# Patient Record
Sex: Female | Born: 1995 | Race: Black or African American | Hispanic: No | Marital: Single | Smoking: Never smoker
Health system: Southern US, Community
[De-identification: ages and names within clinical notes are randomized; demographics above are authoritative.]

## PROBLEM LIST (undated history)

## (undated) DIAGNOSIS — Z789 Other specified health status: Secondary | ICD-10-CM

## (undated) HISTORY — PX: NO PAST SURGERIES: SHX2092

## (undated) HISTORY — DX: Other specified health status: Z78.9

---
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
## 2018-12-14 ENCOUNTER — Other Ambulatory Visit: Payer: Self-pay

## 2018-12-14 ENCOUNTER — Emergency Department
Admission: EM | Admit: 2018-12-14 | Discharge: 2018-12-14 | Disposition: A | Discharge status: Home / Self Care | Payer: Self-pay | Attending: Emergency Medicine | Admitting: Emergency Medicine

## 2018-12-14 ENCOUNTER — Encounter: Payer: Self-pay | Admitting: Emergency Medicine

## 2018-12-14 ENCOUNTER — Emergency Department: Payer: Self-pay

## 2018-12-14 DIAGNOSIS — M779 Enthesopathy, unspecified: Secondary | ICD-10-CM | POA: Insufficient documentation

## 2018-12-14 DIAGNOSIS — M778 Other enthesopathies, not elsewhere classified: Secondary | ICD-10-CM

## 2018-12-14 MED ORDER — MELOXICAM 15 MG PO TABS: 15.0000 mg | ORAL_TABLET | Freq: Every day | ORAL | 0 refills | Status: DC

## 2018-12-14 NOTE — ED Notes (Signed)
See triage note  States she developed a toe injury over 1 week ago  States she thinks she bent her left great toe  conts to have pain

## 2018-12-14 NOTE — ED Provider Notes (Signed)
[facility] Emergency Department Provider Note  ____________________________________________  Time seen: Approximately 6:23 PM  I have reviewed the triage vital signs and the nursing notes.   HISTORY  Chief Complaint Toe Pain    HPI Marie Faulkner is a 23 y.o. female who presents the emergency department complaining of intermittent pain to the dorsal aspect of the great digit left foot.  Patient reports that she had an injury to the toe several weeks or months ago.  Patient reports that she has had intermittent pain to the area that she describes as a burning sensation.  No other injury or complaint.  No medications for his complaint prior to arrival.    History reviewed. No pertinent past medical history.  There are no active problems to display for this patient.   History reviewed. No pertinent surgical history.  Prior to Admission medications   Medication Sig Start Date End Date Taking? Authorizing Provider  meloxicam (MOBIC) 15 MG tablet Take 1 tablet (15 mg total) by mouth daily. 12/14/18   , Delorise Royals, PA-C    Allergies Patient has no known allergies.  No family history on file.  Social History Social History   Tobacco Use  . Smoking status: Never Smoker  . Smokeless tobacco: Never Used  Substance Use Topics  . Alcohol use: No  . Drug use: Not on file     Review of Systems  Constitutional: No fever/chills Eyes: No visual changes.  Cardiovascular: no chest pain. Respiratory: no cough. No SOB. Gastrointestinal: No abdominal pain.  No nausea, no vomiting.  Musculoskeletal: Positive for pain along the dorsal aspect of the great toe left foot Skin: Negative for rash, abrasions, lacerations, ecchymosis. Neurological: Negative for headaches, focal weakness or numbness. 10-point ROS otherwise negative.  ____________________________________________   PHYSICAL EXAM:  VITAL SIGNS: ED Triage Vitals  Enc Vitals Group     BP  12/14/18 1652 126/75     Pulse Rate 12/14/18 1652 91     Resp 12/14/18 1652 18     Temp 12/14/18 1652 98.7 F (37.1 C)     Temp Source 12/14/18 1652 Oral     SpO2 12/14/18 1652 98 %     Weight 12/14/18 1654 168 lb (76.2 kg)     Height 12/14/18 1654 5\' 3"  (1.6 m)     Head Circumference --      Peak Flow --      Pain Score 12/14/18 1654 8     Pain Loc --      Pain Edu? --      Excl. in GC? --      Constitutional: Alert and oriented. Well appearing and in no acute distress. Eyes: Conjunctivae are normal. PERRL. EOMI. Head: Atraumatic. Neck: No stridor.    Cardiovascular: Normal rate, regular rhythm. Normal S1 and S2.  Good peripheral circulation. Respiratory: Normal respiratory effort without tachypnea or retractions. Lungs CTAB. Good air entry to the bases with no decreased or absent breath sounds. Musculoskeletal: Full range of motion to all extremities. No gross deformities appreciated.  Visualization of the left foot reveals no visible abnormality.  Full range of motion all toes.  Sensation intact all toes.  Capillary refill less than 2 seconds all digits.  Patient has mild pain with extension of the great toe but no palpable tenderness. Neurologic:  Normal speech and language. No gross focal neurologic deficits are appreciated.  Skin:  Skin is warm, dry and intact. No rash noted. Psychiatric: Mood and affect are normal.  Speech and behavior are normal. Patient exhibits appropriate insight and judgement.   ____________________________________________   LABS (all labs ordered are listed, but only abnormal results are displayed)  Labs Reviewed - No data to display ____________________________________________  EKG   ____________________________________________  RADIOLOGY I personally viewed and evaluated these images as part of my medical decision making, as well as reviewing the written report by the radiologist.  Dg Toe Great Left  Result Date: 12/14/2018 CLINICAL DATA:   Left great toe injury, pain EXAM: LEFT GREAT TOE COMPARISON:  None. FINDINGS: There is no evidence of fracture or dislocation. There is no evidence of arthropathy or other focal bone abnormality. Soft tissues are unremarkable. IMPRESSION: Negative. Electronically Signed   By: Charlett Nose M.D.   On: 12/14/2018 17:36    ____________________________________________    PROCEDURES  Procedure(s) performed:    Procedures    Medications - No data to display   ____________________________________________   INITIAL IMPRESSION / ASSESSMENT AND PLAN / ED COURSE  Pertinent labs & imaging results that were available during my care of the patient were reviewed by me and considered in my medical decision making (see chart for details).  Review of the [place] CSRS was performed in accordance of the [facility] prior to dispensing any controlled drugs.      Patient's diagnosis is consistent with tendinitis of the great toe.  Patient presents emergency department with intermittent pain to the great toe after an injury that occurred weeks to months ago.  Exam was reassuring.  X-ray reveals no acute osseous abnormality or signs of healing fracture.  Symptoms are most consistent with mild tendinitis..  Patiently placed on meloxicam.  Postop shoe given to the patient.  Follow-up with podiatry if symptoms do not improve.  Patient is given ED precautions to return to the ED for any worsening or new symptoms.     ____________________________________________  FINAL CLINICAL IMPRESSION(S) / ED DIAGNOSES  Final diagnoses:  Tendinitis of toe      NEW MEDICATIONS STARTED DURING THIS VISIT:  ED Discharge Orders         Ordered    meloxicam (MOBIC) 15 MG tablet  Daily     12/14/18 1848              This chart was dictated using voice recognition software/Dragon. Despite best efforts to proofread, errors can occur which can change the meaning. Any change was purely unintentional.    Racheal Patches, PA-C 12/14/18 1850    Phineas Semen, MD 12/14/18 Ernestina Columbia

## 2018-12-14 NOTE — ED Triage Notes (Signed)
PT reports injuring her left big toe over a week ago. Pt states the pain has been intermittent until today. Pt states the pain has been constant today.

## 2019-05-10 ENCOUNTER — Encounter: Payer: Self-pay | Admitting: Intensive Care

## 2019-05-10 ENCOUNTER — Emergency Department
Admission: EM | Admit: 2019-05-10 | Discharge: 2019-05-10 | Disposition: A | Discharge status: Home / Self Care | Payer: Self-pay | Attending: Emergency Medicine | Admitting: Emergency Medicine

## 2019-05-10 ENCOUNTER — Other Ambulatory Visit: Payer: Self-pay

## 2019-05-10 DIAGNOSIS — W25XXXA Contact with sharp glass, initial encounter: Secondary | ICD-10-CM | POA: Insufficient documentation

## 2019-05-10 DIAGNOSIS — S91332A Puncture wound without foreign body, left foot, initial encounter: Secondary | ICD-10-CM | POA: Insufficient documentation

## 2019-05-10 DIAGNOSIS — Y999 Unspecified external cause status: Secondary | ICD-10-CM | POA: Insufficient documentation

## 2019-05-10 DIAGNOSIS — Y9389 Activity, other specified: Secondary | ICD-10-CM | POA: Insufficient documentation

## 2019-05-10 DIAGNOSIS — Y929 Unspecified place or not applicable: Secondary | ICD-10-CM | POA: Insufficient documentation

## 2019-05-10 MED ORDER — SULFAMETHOXAZOLE-TRIMETHOPRIM 800-160 MG PO TABS: 1.0000 | ORAL_TABLET | Freq: Two times a day (BID) | ORAL | 0 refills | Status: DC

## 2019-05-10 MED ORDER — NAPROXEN 500 MG PO TABS: 500.0000 mg | ORAL_TABLET | Freq: Two times a day (BID) | ORAL | 0 refills | Status: DC

## 2019-05-10 NOTE — ED Triage Notes (Signed)
Pt states she is here due to stepping on glass yesterday. Injury to left foot.

## 2019-05-10 NOTE — ED Notes (Signed)
See triage note  Presents with pain to left foot   States she stepped on a piece of glass yesterday while cleaning a broken mirror  States she was able to remove some glass unsure if that is still some glass left

## 2019-05-10 NOTE — ED Provider Notes (Signed)
[facility] Emergency Department Provider Note   ____________________________________________   First MD Initiated Contact with Patient 05/10/19 1207     (approximate)  I have reviewed the triage vital signs and the nursing notes.   HISTORY  Chief Complaint Foot Injury    HPI Marie Faulkner is a [age] y.o. female patient presents with plantar puncture wound to the left foot.  Patient states she stepped on a piece of glass yesterday.  Patient states she removed glass intact but still has pain with ambulation and weightbearing.  Patient rates pain as a 7/10.  Patient described the pain is "achy".  No palliative measure for complaint.         History reviewed. No pertinent past medical history.  There are no active problems to display for this patient.   History reviewed. No pertinent surgical history.  Prior to Admission medications   Medication Sig Start Date End Date Taking? Authorizing Provider  naproxen (NAPROSYN) 500 MG tablet Take 1 tablet (500 mg total) by mouth 2 (two) times daily with a meal. 05/10/19   Joni ReiningSmith, Staphany Ditton K, PA-C  sulfamethoxazole-trimethoprim (BACTRIM DS) 800-160 MG tablet Take 1 tablet by mouth 2 (two) times daily. 05/10/19   Joni ReiningSmith, Qualyn Oyervides K, PA-C    Allergies Patient has no known allergies.  No family history on file.  Social History Social History   Tobacco Use  . Smoking status: Never Smoker  . Smokeless tobacco: Never Used  Substance Use Topics  . Alcohol use: No  . Drug use: Not on file    Review of Systems  Constitutional: No fever/chills Eyes: No visual changes. ENT: No sore throat. Cardiovascular: Denies chest pain. Respiratory: Denies shortness of breath. Gastrointestinal: No abdominal pain.  No nausea, no vomiting.  No diarrhea.  No constipation. Genitourinary: Negative for dysuria. Musculoskeletal: Negative for back pain. Skin: Negative for rash.  Puncture wound left foot Neurological: Negative for  headaches, focal weakness or numbness.   ____________________________________________   PHYSICAL EXAM:  VITAL SIGNS: ED Triage Vitals  Enc Vitals Group     BP 05/10/19 1206 105/70     Pulse Rate 05/10/19 1206 67     Resp 05/10/19 1206 16     Temp 05/10/19 1206 98.2 F (36.8 C)     Temp Source 05/10/19 1206 Oral     SpO2 05/10/19 1206 99 %     Weight 05/10/19 1207 160 lb (72.6 kg)     Height 05/10/19 1207 5\' 2"  (1.575 m)     Head Circumference --      Peak Flow --      Pain Score 05/10/19 1207 7     Pain Loc --      Pain Edu? --      Excl. in GC? --    Constitutional: Alert and oriented. Well appearing and in no acute distress. Cardiovascular: Normal rate, regular rhythm. Grossly normal heart sounds.  Good peripheral circulation. Respiratory: Normal respiratory effort.  No retractions. Lungs CTAB. Musculoskeletal: No lower extremity tenderness nor edema.  No joint effusions. Neurologic:  Normal speech and language. No gross focal neurologic deficits are appreciated. No gait instability. Skin: Puncture wound plantar aspect of left foot.  Moderate guarding palpation.  Mild erythema.  No evidence of retained foreign body. Psychiatric: Mood and affect are normal. Speech and behavior are normal.  ____________________________________________   LABS (all labs ordered are listed, but only abnormal results are displayed)  Labs Reviewed - No data to display ____________________________________________  EKG   ____________________________________________  RADIOLOGY  ED MD interpretation:    Official radiology report(s): No results found.  ____________________________________________   PROCEDURES  Procedure(s) performed (including Critical Care):  Procedures   ____________________________________________   INITIAL IMPRESSION / ASSESSMENT AND PLAN / ED COURSE  As part of my medical decision making, I reviewed the following data within the electronic medical  record:          Mckensie Chait was evaluated in Emergency Department on 05/10/2019 for the symptoms described in the history of present illness. She was evaluated in the context of the global COVID-19 pandemic, which necessitated consideration that the patient might be at risk for infection with the SARS-CoV-2 virus that causes COVID-19. Institutional protocols and algorithms that pertain to the evaluation of patients at risk for COVID-19 are in a state of rapid change based on information released by regulatory bodies including the CDC and federal and state organizations. These policies and algorithms were followed during the patient's care in the ED.   Patient presents with puncture wound to the left foot.  Incident occurred last night.  Patient did pain with weightbearing and ambulation.  Foot exam negative for retained foreign body.  Patient given discharge care instructions advised take medication as directed.  Patient advised [facility].  Patient given a work note for 2 days.      ____________________________________________   FINAL CLINICAL IMPRESSION(S) / ED DIAGNOSES  Final diagnoses:  Puncture wound of left foot, initial encounter     ED Discharge Orders         Ordered    naproxen (NAPROSYN) 500 MG tablet  2 times daily with meals     05/10/19 1232    sulfamethoxazole-trimethoprim (BACTRIM DS) 800-160 MG tablet  2 times daily     05/10/19 1232           Note:  This document was prepared using Dragon voice recognition software and may include unintentional dictation errors.    Sable Feil, PA-C 05/10/19 1237    Earleen Newport, MD 05/10/19 [phone]

## 2019-07-05 ENCOUNTER — Other Ambulatory Visit: Payer: Self-pay

## 2019-07-05 ENCOUNTER — Emergency Department (HOSPITAL_COMMUNITY)
Admission: EM | Admit: 2019-07-05 | Discharge: 2019-07-06 | Disposition: A | Discharge status: Home / Self Care | Payer: Self-pay | Attending: Emergency Medicine | Admitting: Emergency Medicine

## 2019-07-05 ENCOUNTER — Emergency Department (HOSPITAL_COMMUNITY): Payer: Self-pay

## 2019-07-05 DIAGNOSIS — Z791 Long term (current) use of non-steroidal anti-inflammatories (NSAID): Secondary | ICD-10-CM | POA: Insufficient documentation

## 2019-07-05 DIAGNOSIS — Z79899 Other long term (current) drug therapy: Secondary | ICD-10-CM | POA: Insufficient documentation

## 2019-07-05 DIAGNOSIS — M79641 Pain in right hand: Secondary | ICD-10-CM | POA: Insufficient documentation

## 2019-07-05 NOTE — ED Triage Notes (Signed)
Pt presents POV with Right hand pain, states she fell and her hand "slammed across the door."

## 2019-07-06 NOTE — ED Provider Notes (Signed)
[facility]ENC[facility] Provider Note   CSN: [id number] Arrival date & time: 07/05/19  2214     History   Chief Complaint Chief Complaint  Patient presents with  . Hand Pain    HPI Marie Faulkner is a 23 y.o. female.  Patient presents to the emergency department with a complaint of right hand pain. Symptoms began two hours ago when she smacked the back of her hand on a metal doorframe. Patient states pain is sharp and achy and constant. Endorses swelling and decreased ability to move her hand. Patient is concerned she broke her hand. No history of fractures.       HPI  No past medical history on file.  There are no active problems to display for this patient.   No past surgical history on file.   OB History   No obstetric history on file.      Home Medications    Prior to Admission medications   Medication Sig Start Date End Date Taking? Authorizing Provider  naproxen (NAPROSYN) 500 MG tablet Take 1 tablet (500 mg total) by mouth 2 (two) times daily with a meal. 05/10/19   Joni ReiningSmith, Ronald K, PA-C  sulfamethoxazole-trimethoprim (BACTRIM DS) 800-160 MG tablet Take 1 tablet by mouth 2 (two) times daily. 05/10/19   Joni ReiningSmith, Ronald K, PA-C    Family History No family history on file.  Social History Social History   Tobacco Use  . Smoking status: Never Smoker  . Smokeless tobacco: Never Used  Substance Use Topics  . Alcohol use: No  . Drug use: Not on file     Allergies   Patient has no known allergies.   Review of Systems Review of Systems  Constitutional: Negative for fever.  HENT: Negative for congestion.   Respiratory: Negative for shortness of breath.   Cardiovascular: Negative for chest pain.  Gastrointestinal: Negative for abdominal pain.  Genitourinary: Negative for dysuria.  Musculoskeletal: Positive for joint swelling. Negative for back pain.  Skin: Negative for rash.  Neurological: Negative for dizziness.      Physical Exam Updated Vital Signs BP 126/66 (BP Location: Left Arm)   Pulse 80   Temp 98.6 F (37 C) (Oral)   Resp 20   SpO2 98%   Physical Exam Physical Exam  Vitals signs and nursing note reviewed.  General: Patient is not in acute distress. Appearance: Normal appearance. He is not ill-appearing.  HENT:  Normocephalic and atraumatic.  Nose normal.  Eyes:  General: No scleral icterus.  Right eye: No discharge. Left eye: No discharge.  Conjunctiva/sclera: Conjunctivae normal.  Neck:  Normal range of motion. No neck tenderness. Cardiovascular:  Normal rate and regular rhythm.  Normal distal pulses  Pulmonary:  Effort: Pulmonary effort is normal. No respiratory distress.   Musculoskeletal:  ROM: Full active and passive ROM to the elbows, wrists with decreased range of motion of fingers secondary to pain.  Strength: no weakness noted  Sensation: Sensation intact symmetrically distal to location of pain.  Palpation: Tenderness to palpation over the right fifth metacarpal with point of maximal tenderness mid-metacarpal.  There is no snuffbox tenderness.  Skin:  Skin is warm and dry.  Capillary refill takes less than 2 seconds.  Neurological:  Patient is alert and oriented to person, place, and time. Mental status is at baseline.  Sensation noted in MSK exam. Psychiatric:  Mood normal. Behavior normal.  Nursing note and vitals reviewed.     ED Treatments / Results  Labs (  all labs ordered are listed, but only abnormal results are displayed) Labs Reviewed - No data to display  EKG    Radiology Dg Hand Complete Right  Result Date: 07/05/2019 CLINICAL DATA:  Right hand pain after fall EXAM: RIGHT HAND - COMPLETE 3+ VIEW COMPARISON:  None. FINDINGS: There is no evidence of fracture or dislocation. There is no evidence of arthropathy or other focal bone abnormality. Soft tissues are unremarkable. IMPRESSION: Negative. Electronically Signed   By: Donavan Foil M.D.    On: 07/05/2019 23:01    Procedures Procedures (including critical care time)  Medications Ordered in ED Medications - No data to display   Initial Impression / Assessment and Plan / ED Course  I have reviewed the triage vital signs and the nursing notes.  Pertinent labs & imaging results that were available during my care of the patient were reviewed by me and considered in my medical decision making (see chart for details).      patient is distally neurovascularly intact with stable vitals and clear mentation.   No fractures on x-ray of hand.  Patient is able to move fingers appropriately.  Patient educated on likely diagnosis of contusion discussed possibility of small, occult fracture which would need follow-up x-rays if symptoms persist.  Patient offered resources including orthopedic follow up, stretching exercises and NSAID use instructions. Patient is understanding of plan.    Final Clinical Impressions(s) / ED Diagnoses   Final diagnoses:  None    ED Discharge Orders    None        Tedd Sias, [place] 07/06/19 0101    Deno Etienne, DO 07/06/19 0330

## 2019-07-06 NOTE — Discharge Instructions (Addendum)
I did not see a primary care doctor in your chart so I included the information for the [facility].  Your examination today is most concerning for a hand contusion 1. Medications: alternate ibuprofen and tylenol for pain control, usual home medications 2. Treatment: rest, ice, elevate and drink plenty of fluids, gentle stretching 3. Follow Up: Please followup with orthopedics as directed or your PCP in 1 week if no improvement for discussion of your diagnoses and further evaluation after today's visit; if you do not have a primary care doctor use the resource guide provided to find one; Please return to the ER for worsening symptoms or other concerns.

## 2019-07-20 ENCOUNTER — Ambulatory Visit (LOCAL_COMMUNITY_HEALTH_CENTER): Payer: Self-pay | Admitting: Family Medicine

## 2019-07-20 ENCOUNTER — Other Ambulatory Visit: Payer: Self-pay

## 2019-07-20 ENCOUNTER — Encounter: Payer: Self-pay | Admitting: Family Medicine

## 2019-07-20 VITALS — BP 103/57 | Ht 62.5 in | Wt 175.6 lb

## 2019-07-20 DIAGNOSIS — Z3009 Encounter for other general counseling and advice on contraception: Secondary | ICD-10-CM

## 2019-07-20 MED ORDER — MULTIVITAMINS PO CAPS: 1.0000 | ORAL_CAPSULE | Freq: Every day | ORAL | 0 refills | Status: DC

## 2019-07-20 NOTE — Progress Notes (Signed)
   IUD Removal  Patient identified, informed consent performed, consent signed.  Patient was in the dorsal lithotomy position, normal external genitalia was noted.  A speculum was placed in the patient's vagina, normal discharge was noted, no lesions. The cervix was visualized, no lesions, no abnormal discharge.  The strings of the IUD were grasped and pulled using ring forceps. The IUD was removed in its entirety.  Patient tolerated the procedure well.    Patient will use abstanance for contraception. Routine preventative health maintenance measures emphasized. Co. To notify clinic if she wanted another method.  Offered condoms

## 2019-07-20 NOTE — Progress Notes (Signed)
Pt desires to have Mirena IUD removed, has had it for 5 years.  Last sex was 2 days ago, without a condom. Abstinence consent signed. MVI given.

## 2020-05-31 ENCOUNTER — Inpatient Hospital Stay (HOSPITAL_COMMUNITY)
Admission: AD | Admit: 2020-05-31 | Discharge: 2020-05-31 | Disposition: A | Discharge status: Home / Self Care | Payer: Medicaid Other | Attending: Obstetrics & Gynecology | Admitting: Obstetrics & Gynecology

## 2020-05-31 ENCOUNTER — Encounter (HOSPITAL_COMMUNITY): Payer: Self-pay | Admitting: Obstetrics & Gynecology

## 2020-05-31 ENCOUNTER — Inpatient Hospital Stay (HOSPITAL_COMMUNITY): Payer: Medicaid Other

## 2020-05-31 ENCOUNTER — Other Ambulatory Visit: Payer: Self-pay

## 2020-05-31 DIAGNOSIS — O99321 Drug use complicating pregnancy, first trimester: Secondary | ICD-10-CM | POA: Diagnosis not present

## 2020-05-31 DIAGNOSIS — F1290 Cannabis use, unspecified, uncomplicated: Secondary | ICD-10-CM | POA: Diagnosis not present

## 2020-05-31 DIAGNOSIS — O219 Vomiting of pregnancy, unspecified: Secondary | ICD-10-CM

## 2020-05-31 DIAGNOSIS — Z791 Long term (current) use of non-steroidal anti-inflammatories (NSAID): Secondary | ICD-10-CM | POA: Diagnosis not present

## 2020-05-31 DIAGNOSIS — Z3490 Encounter for supervision of normal pregnancy, unspecified, unspecified trimester: Secondary | ICD-10-CM

## 2020-05-31 DIAGNOSIS — O208 Other hemorrhage in early pregnancy: Secondary | ICD-10-CM

## 2020-05-31 DIAGNOSIS — Z3A09 9 weeks gestation of pregnancy: Secondary | ICD-10-CM | POA: Insufficient documentation

## 2020-05-31 DIAGNOSIS — O418X10 Other specified disorders of amniotic fluid and membranes, first trimester, not applicable or unspecified: Secondary | ICD-10-CM

## 2020-05-31 DIAGNOSIS — Z3A01 Less than 8 weeks gestation of pregnancy: Secondary | ICD-10-CM

## 2020-05-31 DIAGNOSIS — R8271 Bacteriuria: Secondary | ICD-10-CM

## 2020-05-31 HISTORY — DX: Other specified health status: Z78.9

## 2020-05-31 LAB — URINALYSIS, ROUTINE W REFLEX MICROSCOPIC
Bilirubin Urine: NEGATIVE
Glucose, UA: NEGATIVE mg/dL
Hgb urine dipstick: NEGATIVE
Ketones, ur: NEGATIVE mg/dL
Nitrite: NEGATIVE
Protein, ur: NEGATIVE mg/dL
Specific Gravity, Urine: 1.005 (ref 1.005–1.030)
pH: 8 (ref 5.0–8.0)

## 2020-05-31 LAB — POCT PREGNANCY, URINE: Preg Test, Ur: POSITIVE — AB

## 2020-05-31 LAB — ABO/RH: ABO/RH(D): O POS

## 2020-05-31 MED ORDER — PROMETHAZINE HCL 25 MG PO TABS
12.5000 mg | ORAL_TABLET | Freq: Once | ORAL | Status: AC
  Administered 2020-05-31: 12.5 mg via ORAL
  Filled 2020-05-31: qty 1

## 2020-05-31 MED ORDER — PROMETHAZINE HCL 12.5 MG RE SUPP: 12.5000 mg | Freq: Four times a day (QID) | RECTAL | 1 refills | Status: DC | PRN

## 2020-05-31 MED ORDER — PROMETHAZINE HCL 12.5 MG PO TABS: 12.5000 mg | ORAL_TABLET | Freq: Four times a day (QID) | ORAL | 2 refills | Status: DC | PRN

## 2020-05-31 MED ORDER — FAMOTIDINE 20 MG PO TABS: 20.0000 mg | ORAL_TABLET | Freq: Every day | ORAL | 1 refills | Status: DC

## 2020-05-31 MED ORDER — FAMOTIDINE 20 MG PO TABS
20.0000 mg | ORAL_TABLET | Freq: Once | ORAL | Status: AC
  Administered 2020-05-31: 20 mg via ORAL
  Filled 2020-05-31: qty 1

## 2020-05-31 NOTE — Discharge Instructions (Signed)
Safe Medications in Pregnancy    Acne: Benzoyl Peroxide Salicylic Acid  Backache/Headache: Tylenol: 2 regular strength every 4 hours OR              2 Extra strength every 6 hours  Colds/Coughs/Allergies: Benadryl (alcohol free) 25 mg every 6 hours as needed Breath right strips Claritin Cepacol throat lozenges Chloraseptic throat spray Cold-Eeze- up to three times per day Cough drops, alcohol free Flonase (by prescription only) Guaifenesin Mucinex Robitussin DM (plain only, alcohol free) Saline nasal spray/drops Sudafed (pseudoephedrine) & Actifed ** use only after [redacted] weeks gestation and if you do not have high blood pressure Tylenol Vicks Vaporub Zinc lozenges Zyrtec   Constipation: Colace Ducolax suppositories Fleet enema Glycerin suppositories Metamucil Milk of magnesia Miralax Senokot Smooth move tea  Diarrhea: Kaopectate Imodium A-D  *NO pepto Bismol  Hemorrhoids: Anusol Anusol HC Preparation H Tucks  Indigestion: Tums Maalox Mylanta Zantac  Pepcid  Insomnia: Benadryl (alcohol free) 25mg  every 6 hours as needed Tylenol PM Unisom, no Gelcaps  Leg Cramps: Tums MagGel  Nausea/Vomiting:  Bonine Dramamine Emetrol Ginger extract Sea bands Meclizine  Nausea medication to take during pregnancy:  Unisom (doxylamine succinate 25 mg tablets) Take one tablet daily at bedtime. If symptoms are not adequately controlled, the dose can be increased to a maximum recommended dose of two tablets daily (1/2 tablet in the morning, 1/2 tablet mid-afternoon and one at bedtime). Vitamin B6 100mg  tablets. Take one tablet twice a day (up to 200 mg per day).  Skin Rashes: Aveeno products Benadryl cream or 25mg  every 6 hours as needed Calamine Lotion 1% cortisone cream  Yeast infection: Gyne-lotrimin 7 Monistat 7   **If taking multiple medications, please check labels to avoid duplicating the same active ingredients **take  medication as directed on the label ** Do not exceed 4000 mg of tylenol in 24 hours **Do not take medications that contain aspirin or ibuprofen          Prenatal Care Providers           [facility] @ [facility] - accepts patients without insurance  Phone: [phone]  Center for @ [facility]   Phone: [phone]  [facility] @[place]       Phone: [phone]            [facility] @ [place]     Phone: [phone]          Center for [id number] @ [facility]   Phone: [phone]  [facility] @ [facility] - accepts patients without insurance  Phone: [phone]  [facility] for [facility] @ [facility] Phone: [phone]     [facility] Department - accepts patients without insurance Phone: [phone]  [facility]  Phone: [phone]  [facility] Phone: [phone]  [facility] Phone: [phone]  [facility] Phone: [phone]  [facility] Phone: [phone]  [facility]  Phone: [phone]         First Trimester of Pregnancy The first trimester of pregnancy is from week 1 until the end of week 13 (months 1 through 3). A week after a sperm fertilizes an egg, the egg will implant on the wall of the uterus. This embryo will begin to develop into a baby. Genes from you and your partner will form the baby. The female genes will determine whether the baby will be a boy or a girl. At 6-8  weeks, the eyes and face will be formed, and the heartbeat can be seen on ultrasound. At the end of 12 weeks, all the baby's organs will be formed. Now that you are pregnant, you will want to do everything you can to have a healthy baby. Two of the most important things are to get good prenatal care and to follow your health care provider's instructions. Prenatal care is all the  medical care you receive before the baby's birth. This care will help prevent, find, and treat any problems during the pregnancy and childbirth. Body changes during your first trimester Your body goes through many changes during pregnancy. The changes vary from woman to woman.  You may gain or lose a couple of pounds at first.  You may feel sick to your stomach (nauseous) and you may throw up (vomit). If the vomiting is uncontrollable, call your health care provider.  You may tire easily.  You may develop headaches that can be relieved by medicines. All medicines should be approved by your health care provider.  You may urinate more often. Painful urination may mean you have a bladder infection.  You may develop heartburn as a result of your pregnancy.  You may develop constipation because certain hormones are causing the muscles that push stool through your intestines to slow down.  You may develop hemorrhoids or swollen veins (varicose veins).  Your breasts may begin to grow larger and become tender. Your nipples may stick out more, and the tissue that surrounds them (areola) may become darker.  Your gums may bleed and may be sensitive to brushing and flossing.  Dark spots or blotches (chloasma, mask of pregnancy) may develop on your face. This will likely fade after the baby is born.  Your menstrual periods will stop.  You may have a loss of appetite.  You may develop cravings for certain kinds of food.  You may have changes in your emotions from day to day, such as being excited to be pregnant or being concerned that something may go wrong with the pregnancy and baby.  You may have more vivid and strange dreams.  You may have changes in your hair. These can include thickening of your hair, rapid growth, and changes in texture. Some women also have hair loss during or after pregnancy, or hair that feels dry or thin. Your hair will most likely return to normal after your baby is  born. What to expect at prenatal visits During a routine prenatal visit:  You will be weighed to make sure you and the baby are growing normally.  Your blood pressure will be taken.  Your abdomen will be measured to track your baby's growth.  The fetal heartbeat will be listened to between weeks 10 and 14 of your pregnancy.  Test results from any previous visits will be discussed. Your health care provider may ask you:  How you are feeling.  If you are feeling the baby move.  If you have had any abnormal symptoms, such as leaking fluid, bleeding, severe headaches, or abdominal cramping.  If you are using any tobacco products, including cigarettes, chewing tobacco, and electronic cigarettes.  If you have any questions. Other tests that may be performed during your first trimester include:  Blood tests to find your blood type and to check for the presence of any previous infections. The tests will also be used to check for low iron levels (anemia) and protein on red blood cells (Rh antibodies). Depending on your risk  factors, or if you previously had diabetes during pregnancy, you may have tests to check for high blood sugar that affects pregnant women (gestational diabetes).  Urine tests to check for infections, diabetes, or protein in the urine.  An ultrasound to confirm the proper growth and development of the baby.  Fetal screens for spinal cord problems (spina bifida) and Down syndrome.  HIV (human immunodeficiency virus) testing. Routine prenatal testing includes screening for HIV, unless you choose not to have this test.  You may need other tests to make sure you and the baby are doing well. Follow these instructions at home: Medicines  Follow your health care provider's instructions regarding medicine use. Specific medicines may be either safe or unsafe to take during pregnancy.  Take a prenatal vitamin that contains at least 600 micrograms (mcg) of folic acid.  If  you develop constipation, try taking a stool softener if your health care provider approves. Eating and drinking   Eat a balanced diet that includes fresh fruits and vegetables, whole grains, good sources of protein such as meat, eggs, or tofu, and low-fat dairy. Your health care provider will help you determine the amount of weight gain that is right for you.  Avoid raw meat and uncooked cheese. These carry germs that can cause birth defects in the baby.  Eating four or five small meals rather than three large meals a day may help relieve nausea and vomiting. If you start to feel nauseous, eating a few soda crackers can be helpful. Drinking liquids between meals, instead of during meals, also seems to help ease nausea and vomiting.  Limit foods that are high in fat and processed sugars, such as fried and sweet foods.  To prevent constipation: ? Eat foods that are high in fiber, such as fresh fruits and vegetables, whole grains, and beans. ? Drink enough fluid to keep your urine clear or pale yellow. Activity  Exercise only as directed by your health care provider. Most women can continue their usual exercise routine during pregnancy. Try to exercise for 30 minutes at least 5 days a week. Exercising will help you: ? Control your weight. ? Stay in shape. ? Be prepared for labor and delivery.  Experiencing pain or cramping in the lower abdomen or lower back is a good sign that you should stop exercising. Check with your health care provider before continuing with normal exercises.  Try to avoid standing for long periods of time. Move your legs often if you must stand in one place for a long time.  Avoid heavy lifting.  Wear low-heeled shoes and practice good posture.  You may continue to have sex unless your health care provider tells you not to. Relieving pain and discomfort  Wear a good support bra to relieve breast tenderness.  Take warm sitz baths to soothe any pain or discomfort  caused by hemorrhoids. Use hemorrhoid cream if your health care provider approves.  Rest with your legs elevated if you have leg cramps or low back pain.  If you develop varicose veins in your legs, wear support hose. Elevate your feet for 15 minutes, 3-4 times a day. Limit salt in your diet. Prenatal care  Schedule your prenatal visits by the twelfth week of pregnancy. They are usually scheduled monthly at first, then more often in the last 2 months before delivery.  Write down your questions. Take them to your prenatal visits.  Keep all your prenatal visits as told by your health care provider. This is  important. Safety  Wear your seat belt at all times when driving.  Make a list of emergency phone numbers, including numbers for family, friends, the hospital, and police and fire departments. General instructions  Ask your health care provider for a referral to a local prenatal education class. Begin classes no later than the beginning of month 6 of your pregnancy.  Ask for help if you have counseling or nutritional needs during pregnancy. Your health care provider can offer advice or refer you to specialists for help with various needs.  Do not use hot tubs, steam rooms, or saunas.  Do not douche or use tampons or scented sanitary pads.  Do not cross your legs for long periods of time.  Avoid cat litter boxes and soil used by cats. These carry germs that can cause birth defects in the baby and possibly loss of the fetus by miscarriage or stillbirth.  Avoid all smoking, herbs, alcohol, and medicines not prescribed by your health care provider. Chemicals in these products affect the formation and growth of the baby.  Do not use any products that contain nicotine or tobacco, such as cigarettes and e-cigarettes. If you need help quitting, ask your health care provider. You may receive counseling support and other resources to help you quit.  Schedule a dentist appointment. At home,  brush your teeth with a soft toothbrush and be gentle when you floss. Contact a health care provider if:  You have dizziness.  You have mild pelvic cramps, pelvic pressure, or nagging pain in the abdominal area.  You have persistent nausea, vomiting, or diarrhea.  You have a bad smelling vaginal discharge.  You have pain when you urinate.  You notice increased swelling in your face, hands, legs, or ankles.  You are exposed to fifth disease or chickenpox.  You are exposed to [place] measles (rubella) and have never had it. Get help right away if:  You have a fever.  You are leaking fluid from your vagina.  You have spotting or bleeding from your vagina.  You have severe abdominal cramping or pain.  You have rapid weight gain or loss.  You vomit blood or material that looks like coffee grounds.  You develop a severe headache.  You have shortness of breath.  You have any kind of trauma, such as from a fall or a car accident. Summary  The first trimester of pregnancy is from week 1 until the end of week 13 (months 1 through 3).  Your body goes through many changes during pregnancy. The changes vary from woman to woman.  You will have routine prenatal visits. During those visits, your health care provider will examine you, discuss any test results you may have, and talk with you about how you are feeling. This information is not intended to replace advice given to you by your health care provider. Make sure you discuss any questions you have with your health care provider. Document Revised: 09/18/2017 Document Reviewed: 09/17/2016 Elsevier Patient Education  2020 Elsevier Inc.        Morning Sickness  Morning sickness is when a woman feels nauseous during pregnancy. This nauseous feeling may or may not come with vomiting. It often occurs in the morning, but it can be a problem at any time of day. Morning sickness is most common during the first trimester. In some  cases, it may continue throughout pregnancy. Although morning sickness is unpleasant, it is usually harmless unless the woman develops severe and continual vomiting (hyperemesis  gravidarum), a condition that requires more intense treatment. What are the causes? The exact cause of this condition is not known, but it seems to be related to normal hormonal changes that occur in pregnancy. What increases the risk? You are more likely to develop this condition if:  You experienced nausea or vomiting before your pregnancy.  You had morning sickness during a previous pregnancy.  You are pregnant with more than one baby, such as twins. What are the signs or symptoms? Symptoms of this condition include:  Nausea.  Vomiting. How is this diagnosed? This condition is usually diagnosed based on your signs and symptoms. How is this treated? In many cases, treatment is not needed for this condition. Making some changes to what you eat may help to control symptoms. Your health care provider may also prescribe or recommend:  Vitamin B6 supplements.  Anti-nausea medicines.  Ginger. Follow these instructions at home: Medicines  Take over-the-counter and prescription medicines only as told by your health care provider. Do not use any prescription, over-the-counter, or herbal medicines for morning sickness without first talking with your health care provider.  Taking multivitamins before getting pregnant can prevent or decrease the severity of morning sickness in most women. Eating and drinking  Eat a piece of dry toast or crackers before getting out of bed in the morning.  Eat 5 or 6 small meals a day.  Eat dry and bland foods, such as rice or a baked potato. Foods that are high in carbohydrates are often helpful.  Avoid greasy, fatty, and spicy foods.  Have someone cook for you if the smell of any food causes nausea and vomiting.  If you feel nauseous after taking prenatal vitamins, take the  vitamins at night or with a snack.  Snack on protein foods between meals if you are hungry. Nuts, yogurt, and cheese are good options.  Drink fluids throughout the day.  Try ginger ale made with real ginger, ginger tea made from fresh grated ginger, or ginger candies. General instructions  Do not use any products that contain nicotine or tobacco, such as cigarettes and e-cigarettes. If you need help quitting, ask your health care provider.  Get an air purifier to keep the air in your house free of odors.  Get plenty of fresh air.  Try to avoid odors that trigger your nausea.  Consider trying these methods to help relieve symptoms: ? Wearing an acupressure wristband. These wristbands are often worn for seasickness. ? Acupuncture. Contact a health care provider if:  Your home remedies are not working and you need medicine.  You feel dizzy or light-headed.  You are losing weight. Get help right away if:  You have persistent and uncontrolled nausea and vomiting.  You faint.  You have severe pain in your abdomen. Summary  Morning sickness is when a woman feels nauseous during pregnancy. This nauseous feeling may or may not come with vomiting.  Morning sickness is most common during the first trimester.  It often occurs in the morning, but it can be a problem at any time of day.  In many cases, treatment is not needed for this condition. Making some changes to what you eat may help to control symptoms. This information is not intended to replace advice given to you by your health care provider. Make sure you discuss any questions you have with your health care provider. Document Revised: 09/18/2017 Document Reviewed: 11/08/2016 Elsevier Patient Education  2020 [employer].  Hyperemesis Gravidarum Hyperemesis gravidarum is a severe form of nausea and vomiting that happens during pregnancy. Hyperemesis is worse than morning sickness. It may cause you to have  nausea or vomiting all day for many days. It may keep you from eating and drinking enough food and liquids, which can lead to dehydration, malnutrition, and weight loss. Hyperemesis usually occurs during the first half (the first 20 weeks) of pregnancy. It often goes away once a woman is in her second half of pregnancy. However, sometimes hyperemesis continues through an entire pregnancy. What are the causes? The cause of this condition is not known. It may be related to changes in chemicals (hormones) in the body during pregnancy, such as the high level of pregnancy hormone (human chorionic gonadotropin) or the increase in the female sex hormone (estrogen). What are the signs or symptoms? Symptoms of this condition include:  Nausea that does not go away.  Vomiting that does not allow you to keep any food down.  Weight loss.  Body fluid loss (dehydration).  Having no desire to eat, or not liking food that you have previously enjoyed. How is this diagnosed? This condition may be diagnosed based on:  A physical exam.  Your medical history.  Your symptoms.  Blood tests.  Urine tests. How is this treated? This condition is managed by controlling symptoms. This may include:  Following an eating plan. This can help lessen nausea and vomiting.  Taking prescription medicines. An eating plan and medicines are often used together to help control symptoms. If medicines do not help relieve nausea and vomiting, you may need to receive fluids through an IV at the hospital. Follow these instructions at home: Eating and drinking   Avoid the following: ? Drinking fluids with meals. Try not to drink anything during the 30 minutes before and after your meals. ? Drinking more than 1 cup of fluid at a time. ? Eating foods that trigger your symptoms. These may include spicy foods, coffee, high-fat foods, very sweet foods, and acidic foods. ? Skipping meals. Nausea can be more intense on an empty  stomach. If you cannot tolerate food, do not force it. Try sucking on ice chips or other frozen items and make up for missed calories later. ? Lying down within 2 hours after eating. ? Being exposed to environmental triggers. These may include food smells, smoky rooms, closed spaces, rooms with strong smells, warm or humid places, overly loud and noisy rooms, and rooms with motion or flickering lights. Try eating meals in a well-ventilated area that is free of strong smells. ? Quick and sudden changes in your movement. ? Taking iron pills and multivitamins that contain iron. If you take prescription iron pills, do not stop taking them unless your health care provider approves. ? Preparing food. The smell of food can spoil your appetite or trigger nausea.  To help relieve your symptoms: ? Listen to your body. Everyone is different and has different preferences. Find what works best for you. ? Eat and drink slowly. ? Eat 5-6 small meals daily instead of 3 large meals. Eating small meals and snacks can help you avoid an empty stomach. ? In the morning, before getting out of bed, eat a couple of crackers to avoid moving around on an empty stomach. ? Try eating starchy foods as these are usually tolerated well. Examples include cereal, toast, bread, potatoes, pasta, rice, and pretzels. ? Include at least 1 serving of protein with your meals and snacks. Protein options  include lean meats, poultry, seafood, beans, nuts, nut butters, eggs, cheese, and yogurt. ? Try eating a protein-rich snack before bed. Examples of a protein-rick snack include cheese and crackers or a peanut butter sandwich made with 1 slice of whole-wheat bread and 1 tsp (5 g) of peanut butter. ? Eat or suck on things that have ginger in them. It may help relieve nausea. Add  tsp ground ginger to hot tea or choose ginger tea. ? Try drinking 100% fruit juice or an electrolyte drink. An electrolyte drink contains sodium, potassium, and  chloride. ? Drink fluids that are cold, clear, and carbonated or sour. Examples include lemonade, ginger ale, lemon-lime soda, ice water, and sparkling water. ? Brush your teeth or use a mouth rinse after meals. ? Talk with your health care provider about starting a supplement of vitamin B6. General instructions  Take over-the-counter and prescription medicines only as told by your health care provider.  Follow instructions from your health care provider about eating or drinking restrictions.  Continue to take your prenatal vitamins as told by your health care provider. If you are having trouble taking your prenatal vitamins, talk with your health care provider about different options.  Keep all follow-up and pre-birth (prenatal) visits as told by your health care provider. This is important. Contact a health care provider if:  You have pain in your abdomen.  You have a severe headache.  You have vision problems.  You are losing weight.  You feel weak or dizzy. Get help right away if:  You cannot drink fluids without vomiting.  You vomit blood.  You have constant nausea and vomiting.  You are very weak.  You faint.  You have a fever and your symptoms suddenly get worse. Summary  Hyperemesis gravidarum is a severe form of nausea and vomiting that happens during pregnancy.  Making some changes to your eating habits may help relieve nausea and vomiting.  This condition may be managed with medicine.  If medicines do not help relieve nausea and vomiting, you may need to receive fluids through an IV at the hospital. This information is not intended to replace advice given to you by your health care provider. Make sure you discuss any questions you have with your health care provider. Document Revised: 10/26/2017 Document Reviewed: 06/04/2016 Elsevier Patient Education  2020 Elsevier Inc.        Cannabinoid Hyperemesis Syndrome Cannabinoid hyperemesis syndrome (CHS)  is a condition that causes repeated nausea, vomiting, and abdominal pain after long-term (chronic) use of marijuana (cannabis). People with CHS typically use marijuana 3-5 times a day for many years before they have symptoms, although it is possible to develop CHS with as little as 1 use per day. Symptoms of CHS may be mild at first but can get worse and more frequent. In some cases, CHS may cause vomiting many times a day, which can lead to weight loss and dehydration. CHS may go away and come back many times (recur). People may not have symptoms or may otherwise be healthy in between [facility] attacks. What are the causes? The exact cause of this condition is not known. Long-term use of marijuana may over-stimulate certain proteins in the brain that react with chemicals in marijuana (cannabinoid receptors). This over-stimulation may cause CHS. What are the signs or symptoms? Symptoms of this condition are often mild during the first few attacks, but they can get worse over time. Symptoms may include:  Frequent nausea, especially early in the morning.  Vomiting.  Abdominal pain. Taking several hot showers throughout the day can also be a sign of this condition. People with CHS may do this because it relieves symptoms. How is this diagnosed? This condition may be diagnosed based on:  Your symptoms and medical history, including any drug use.  A physical exam. You may have tests done to rule out other problems. These tests may include:  Blood tests.  Urine tests.  Imaging tests, such as an X-ray or CT scan. How is this treated? Treatment for this condition involves stopping marijuana use. Your health care provider may recommend:  A drug rehabilitation program, if you have trouble stopping marijuana use.  Medicines for nausea.  Hot showers to help relieve symptoms. Certain creams that contain a substance called capsaicin may improve symptoms when applied to the abdomen. Ask your health care  provider before starting any medicines or other treatments. Severe nausea and vomiting may require you to stay at the hospital. You may need IV fluids to prevent or treat dehydration. You may also need certain medicines that must be given at the hospital. Follow these instructions at home: During an attack   Stay in bed and rest in a dark, quiet room.  Take anti-nausea medicine as told by your health care provider.  Try taking hot showers to relieve your symptoms. After an attack  Drink small amounts of clear fluids slowly. Gradually add more.  Once you are able to eat without vomiting, eat soft foods in small amounts every 3-4 hours. General instructions   Do not use any products that contain marijuana.If you need help quitting, ask your health care provider for resources and treatment options.  Drink enough fluid to keep your urine pale yellow. Avoid drinking fluids that have a lot of sugar or caffeine, such as coffee and soda.  Take and apply over-the-counter and prescription medicines only as told by your health care provider. Ask your health care provider before starting any new medicines or treatments.  Keep all follow-up visits as told by your health care provider. This is important. Contact a health care provider if:  Your symptoms get worse.  You cannot drink fluids without vomiting.  You have pain and trouble swallowing after an attack. Get help right away if:  You cannot stop vomiting.  You have blood in your vomit or your vomit looks like coffee grounds.  You have severe abdominal pain.  You have stools that are bloody or black, or stools that look like tar.  You have symptoms of dehydration, such as: ? Sunken eyes. ? Inability to make tears. ? Cracked lips. ? Dry mouth. ? Decreased urine production. ? Weakness. ? Sleepiness. ? Fainting. Summary  Cannabinoid hyperemesis syndrome (CHS) is a condition that causes repeated nausea, vomiting, and abdominal  pain after long-term use of marijuana.  People with CHS typically use marijuana 3-5 times a day for many years before they have symptoms, although it is possible to develop CHS with as little as 1 use per day.  Treatment for this condition involves stopping marijuana use. Hot showers and capsaicin creams may also help relieve symptoms. Ask your health care provider before starting any medicines or other treatments.  Your health care provider may prescribe medicines to help with nausea.  Get help right away if you have signs of dehydration, such as dry mouth, decreased urine production, or weakness. This information is not intended to replace advice given to you by your health care provider. Make sure you discuss  any questions you have with your health care provider. Document Revised: 02/12/2018 Document Reviewed: 01/14/2017 Elsevier Patient Education  2020 [employer].        Marijuana Use During Pregnancy and Breastfeeding  Marijuana is the dried leaves, flowers, and stems of the Cannabis sativa or Cannabis indica plant. The plant's active ingredients (cannabinoids), including a chemical called THC, change the chemistry of the brain. Marijuana smoke also has many of the same chemicals as cigarette smoke that cause breathing problems. Marijuana gets into your blood through your lungs when you smoke it and through your digestive system when you swallow it. Using marijuana in any form may be harmful for you and your baby when you are trying to become pregnant and during pregnancy. This includes marijuana that is prescribed to you by a health care provider (medical marijuana). Once marijuana is in your blood, it can travel through your placenta to your baby. It may also pass through breast milk. How does this affect me? Marijuana affects you both mentally and physically. Using marijuana can make you feel high and relaxed. It can also have negative effects, especially at high doses or with  long-term use. These include:  Rapid heartbeat and stress on your heart.  Lung irritation and breathing problems.  Difficulty thinking and making decisions.  Seeing or believing things that are not true (hallucinations and paranoia).  Mood swings, depression, or anxiety.  Decreased ability to learn and remember.  Difficulty getting pregnant. Marijuana can also affect your pregnancy. Not all the effects are known. However, if you use marijuana during pregnancy, you may:  Be less likely to get regular prenatal care and do the things that you need to do to have a healthy pregnancy.  Be more likely to use other drugs that can harm your pregnancy, like drinking alcohol and smoking cigarettes.  Be at higher risk of having your baby die after 28 weeks of pregnancy (stillbirth).  Be at higher risk of giving birth before 37 weeks of pregnancy (premature birth). How does this affect my baby? If you use marijuana during pregnancy, this may affect your baby's development, birth, and life after birth. Your baby may:  Be born prematurely, which can cause physical and mental problems.  Be born with a low birth weight, which can lead to physical and mental problems.  Have problems with brain development.  Have difficulty growing.  Have attention and behavior problems later in life.  Do poorly at school and have learning problems later in life.  Have problems with vision and coordination.  Be at higher risk for using marijuana by age 109. More research is needed to find out exactly how marijuana affects a baby during breastfeeding. Some studies suggest that the chemicals in marijuana can be passed to a baby through breast milk. To limit possible risks, you should not use marijuana during breastfeeding. Follow these instructions at home:  Let your health care provider know if you use marijuana before trying to get pregnant, during pregnancy, or during breastfeeding.  Do not use marijuana  in any form when you are trying to get pregnant, when you are pregnant, or when you are breastfeeding. If you are having trouble stopping marijuana use, ask your health care provider for help.  Do not smoke. If you need help quitting, ask your health care provider for help.  If you are using medical marijuana, ask your health care provider to switch you to a medicine that is safer to use during pregnancy or breastfeeding.  Keep all your prenatal visits as told by your health care provider. This is important. Where to find more information [employer]: www.drugabuse.gov [employer]: www.marchofdimes.org/pregnancy Contact a health care provider if:  You use marijuana and want to get pregnant.  You use marijuana during pregnancy or breastfeeding.  You need help stopping marijuana use. Get help right away if:  Your baby is not gaining weight or growing as expected. Summary  Using marijuana in any form may be harmful for you and your baby when you are trying to become pregnant, during pregnancy, and during breastfeeding. This includes marijuana that is prescribed to you (medical marijuana).  Some studies suggest that marijuana may pass through breast milk and can affect your baby's brain development.  Talk to your health care provider if you use marijuana in any form while trying to get pregnant, during pregnancy, or while breastfeeding.  Ask your health care provider for help if you are not able to stop using marijuana. This information is not intended to replace advice given to you by your health care provider. Make sure you discuss any questions you have with your health care provider. Document Revised: 01/28/2019 Document Reviewed: 06/24/2017 Elsevier Patient Education  2020 [employer] Inc.        Subchorionic Hematoma  A subchorionic hematoma is a gathering of blood between the outer wall of the embryo (chorion) and the inner wall of the womb  (uterus). This condition can cause vaginal bleeding. If they cause little or no vaginal bleeding, early small hematomas usually shrink on their own and do not affect your baby or pregnancy. When bleeding starts later in pregnancy, or if the hematoma is larger or occurs in older pregnant women, the condition may be more serious. Larger hematomas may get bigger, which increases the chances of miscarriage. This condition also increases the risk of:  Premature separation of the placenta from the uterus.  Premature (preterm) labor.  Stillbirth. What are the causes? The exact cause of this condition is not known. It occurs when blood is trapped between the placenta and the uterine wall because the placenta has separated from the original site of implantation. What increases the risk? You are more likely to develop this condition if:  You were treated with fertility medicines.  You conceived through in vitro fertilization (IVF). What are the signs or symptoms? Symptoms of this condition include:  Vaginal spotting or bleeding.  Contractions of the uterus. These cause abdominal pain. Sometimes you may have no symptoms and the bleeding may only be seen when ultrasound images are taken (transvaginal ultrasound). How is this diagnosed? This condition is diagnosed based on a physical exam. This includes a pelvic exam. You may also have other tests, including:  Blood tests.  Urine tests.  Ultrasound of the abdomen. How is this treated? Treatment for this condition can vary. Treatment may include:  Watchful waiting. You will be monitored closely for any changes in bleeding. During this stage: ? The hematoma may be reabsorbed by the body. ? The hematoma may separate the fluid-filled space containing the embryo (gestational sac) from the wall of the womb (endometrium).  Medicines.  Activity restriction. This may be needed until the bleeding stops. Follow these instructions at home:  Stay on  bed rest if told to do so by your health care provider.  Do not lift anything that is heavier than 10 lbs. (4.5 kg) or as told by your health care provider.  Do not use any  products that contain nicotine or tobacco, such as cigarettes and e-cigarettes. If you need help quitting, ask your health care provider.  Track and write down the number of pads you use each day and how soaked (saturated) they are.  Do not use tampons.  Keep all follow-up visits as told by your health care provider. This is important. Your health care provider may ask you to have follow-up blood tests or ultrasound tests or both. Contact a health care provider if:  You have any vaginal bleeding.  You have a fever. Get help right away if:  You have severe cramps in your stomach, back, abdomen, or pelvis.  You pass large clots or tissue. Save any tissue for your health care provider to look at.  You have more vaginal bleeding, and you faint or become lightheaded or weak. Summary  A subchorionic hematoma is a gathering of blood between the outer wall of the placenta and the uterus.  This condition can cause vaginal bleeding.  Sometimes you may have no symptoms and the bleeding may only be seen when ultrasound images are taken.  Treatment may include watchful waiting, medicines, or activity restriction. This information is not intended to replace advice given to you by your health care provider. Make sure you discuss any questions you have with your health care provider. Document Revised: 09/18/2017 Document Reviewed: 12/02/2016 Elsevier Patient Education  2020 [employer].

## 2020-05-31 NOTE — MAU Note (Signed)
Pt states she started having nausea and vomiting last week and has not been able to keep food down. States she is able to drink. Has vomited about 4-5 times in the last 24 hours. Denies VB/pain/discharge/burning or frequency with urination.

## 2020-05-31 NOTE — MAU Provider Note (Signed)
History     CSN: [id number]  Arrival date and time: 05/31/20 1814   First Provider Initiated Contact with Patient 05/31/20 1924      Chief Complaint  Patient presents with  . Emesis  . Nausea   [name] is a 24 y.o. G2P0010 at [redacted]w[redacted]d who presents to MAU for N/V. Patient reports the last time she smokes marijuana.  Onset: last Wednesday Location: stomach Duration: one week Character: vomiting x5-6 in 24hours, nausea immediately before vomiting and while moving around, pt can eat salads, subway sandwich, water, lemonade, tea, ginger ale Aggravating/Associated: none/none Relieving: sleeping Treatment: none  Pt denies VB, LOF, ctx, decreased FM, vaginal discharge/odor/itching. Pt denies N/V, abdominal pain, constipation, diarrhea, or urinary problems. Pt denies fever, chills, fatigue, sweating or changes in appetite. Pt denies SOB or chest pain. Pt denies dizziness, HA, light-headedness, weakness.  Problems this pregnancy include: pt has not yet been seen. Allergies? Nickel Current medications/supplements? none Prenatal care provider? Pt requests list of OB providers   OB History    Gravida  2   Para      Term      Preterm      AB  1   Living        SAB      TAB  1   Ectopic      Multiple      Live Births              Past Medical History:  Diagnosis Date  . Medical history non-contributory     Past Surgical History:  Procedure Laterality Date  . NO PAST SURGERIES      History reviewed. No pertinent family history.  Social History   Tobacco Use  . Smoking status: Never Smoker  . Smokeless tobacco: Never Used  Substance Use Topics  . Alcohol use: No  . Drug use: Not Currently    Allergies:  Allergies  Allergen Reactions  . Nickel Itching and Rash    Medications Prior to Admission  Medication Sig Dispense Refill Last Dose  . Multiple Vitamin (MULTIVITAMIN) capsule Take 1 capsule by mouth daily. 100 capsule 0 Unknown at  Unknown time  . naproxen (NAPROSYN) 500 MG tablet Take 1 tablet (500 mg total) by mouth 2 (two) times daily with a meal. (Patient not taking: Reported on 07/20/2019) 20 tablet 0   . sulfamethoxazole-trimethoprim (BACTRIM DS) 800-160 MG tablet Take 1 tablet by mouth 2 (two) times daily. (Patient not taking: Reported on 07/20/2019) 20 tablet 0     Review of Systems  Constitutional: Negative for chills, diaphoresis, fatigue and fever.  Eyes: Negative for visual disturbance.  Respiratory: Negative for shortness of breath.   Cardiovascular: Negative for chest pain.  Gastrointestinal: Positive for nausea and vomiting. Negative for abdominal pain, constipation and diarrhea.  Genitourinary: Negative for dysuria, flank pain, frequency, pelvic pain, urgency, vaginal bleeding and vaginal discharge.  Neurological: Negative for dizziness, weakness, light-headedness and headaches.   Physical Exam   Blood pressure 132/72, pulse 83, temperature 98 F (36.7 C), temperature source Oral, resp. rate 17, last menstrual period 03/29/2020, SpO2 100 %.  Patient Vitals for the past 24 hrs:  BP Temp Temp src Pulse Resp SpO2  05/31/20 1837 132/72 98 F (36.7 C) Oral 83 17 100 %   Physical Exam Vitals and nursing note reviewed.  Constitutional:      General: She is not in acute distress.    Appearance: Normal appearance. She is not ill-appearing, toxic-appearing  or diaphoretic.  HENT:     Head: Normocephalic and atraumatic.  Pulmonary:     Effort: Pulmonary effort is normal.  Neurological:     Mental Status: She is alert and oriented to person, place, and time.  Psychiatric:        Mood and Affect: Mood normal.        Behavior: Behavior normal.        Thought Content: Thought content normal.        Judgment: Judgment normal.    Results for orders placed or performed during the hospital encounter of 05/31/20 (from the past 24 hour(s))  Pregnancy, urine POC     Status: Abnormal   Collection Time:  05/31/20  6:33 PM  Result Value Ref Range   Preg Test, Ur POSITIVE (A) NEGATIVE  Urinalysis, Routine w reflex microscopic Urine, Clean Catch     Status: Abnormal   Collection Time: 05/31/20  6:51 PM  Result Value Ref Range   Color, Urine STRAW (A) YELLOW   APPearance CLEAR CLEAR   Specific Gravity, Urine 1.005 1.005 - 1.030   pH 8.0 5.0 - 8.0   Glucose, UA NEGATIVE NEGATIVE mg/dL   Hgb urine dipstick NEGATIVE NEGATIVE   Bilirubin Urine NEGATIVE NEGATIVE   Ketones, ur NEGATIVE NEGATIVE mg/dL   Protein, ur NEGATIVE NEGATIVE mg/dL   Nitrite NEGATIVE NEGATIVE   Leukocytes,Ua TRACE (A) NEGATIVE   RBC / HPF 0-5 0 - 5 RBC/hpf   WBC, UA 0-5 0 - 5 WBC/hpf   Bacteria, UA RARE (A) NONE SEEN   Squamous Epithelial / LPF 6-10 0 - 5   US OB Comp Less 14 Wks  Result Date: 05/31/2020 CLINICAL DATA:  Dating EXAM: OBSTETRIC <14 WK ULTRASOUND TECHNIQUE: Transabdominal ultrasound was performed for evaluation of the gestation as well as the maternal uterus and adnexal regions. COMPARISON:  None. FINDINGS: Intrauterine gestational sac: Single Yolk sac:  Visualized. Embryo:  Visualized. Cardiac Activity: Visualized. Heart Rate: 127 bpm MSD:    mm    w     d CRL:   8.1 mm   6 w 5 d                  [place] EDC: 01/19/2021 Subchorionic hemorrhage:  Small subchorionic hemorrhage Maternal uterus/adnexae: No adnexal masses or free fluid. IMPRESSION: Six week 5 day intrauterine pregnancy. Fetal heart rate 127 beats per minute. Small subchorionic hemorrhage. Electronically Signed   By: Charlett Nose M.D.   On: 05/31/2020 20:36    MAU Course  Procedures  MDM -N/V in pregnancy -UA: straw/trace leuks/rare bacteria, sending urine for culture -12.5mg  Phenergan and 20mg  Pepcid given PO - : single IUP, [redacted]w[redacted]d, FHR 127, sm SCH -ABO ordered, will call patient with negative type results -pt discharged to home in stable condition  Orders Placed This Encounter  Procedures  . Culture, OB Urine    Standing Status:   Standing     Number of Occurrences:   1  . [redacted]w[redacted]d OB Comp Less 14 Wks    Standing Status:   Standing    Number of Occurrences:   1    Order Specific Question:   Symptom/Reason for Exam    Answer:   Uncertain dates, antepartum [place]  . Urinalysis, Routine w reflex microscopic Urine, Clean Catch    Standing Status:   Standing    Number of Occurrences:   1  . Pregnancy, urine POC    Standing Status:   Standing    Number  of Occurrences:   1  . ABO/Rh    Standing Status:   Standing    Number of Occurrences:   1  . Discharge patient    Order Specific Question:   Discharge disposition    Answer:   01-Home or Self Care [1]    Order Specific Question:   Discharge patient date    Answer:   05/31/2020   Meds ordered this encounter  Medications  . famotidine (PEPCID) tablet 20 mg  . promethazine (PHENERGAN) tablet 12.5 mg  . famotidine (PEPCID) 20 MG tablet    Sig: Take 1 tablet (20 mg total) by mouth daily.    Dispense:  30 tablet    Refill:  1    Order Specific Question:   Supervising Provider    Answer:   Mariel Aloe A [[id number]]  . promethazine (PHENERGAN) 12.5 MG tablet    Sig: Take 1 tablet (12.5 mg total) by mouth every 6 (six) hours as needed for nausea or vomiting.    Dispense:  30 tablet    Refill:  2    Order Specific Question:   Supervising Provider    Answer:   Mariel Aloe A [id number]  . promethazine (PHENERGAN) 12.5 MG suppository    Sig: Place 1 suppository (12.5 mg total) rectally every 6 (six) hours as needed for nausea or vomiting.    Dispense:  12 suppository    Refill:  1    Order Specific Question:   Supervising Provider    Answer:   Mariel Aloe A [[id number]]    Assessment and Plan   1. Nausea and vomiting in pregnancy   2. Uncertain dates, antepartum   3. Marijuana use   4. Intrauterine pregnancy   5. Subchorionic hemorrhage of placenta in first trimester, single or unspecified fetus     Allergies as of 05/31/2020      Reactions   Nickel Itching, Rash       Medication List    STOP taking these medications   naproxen 500 MG tablet Commonly known as: Naprosyn     TAKE these medications   famotidine 20 MG tablet Commonly known as: Pepcid Take 1 tablet (20 mg total) by mouth daily.   multivitamin capsule Take 1 capsule by mouth daily.   promethazine 12.5 MG tablet Commonly known as: PHENERGAN Take 1 tablet (12.5 mg total) by mouth every 6 (six) hours as needed for nausea or vomiting.   promethazine 12.5 MG suppository Commonly known as: Phenergan Place 1 suppository (12.5 mg total) rectally every 6 (six) hours as needed for nausea or vomiting.   sulfamethoxazole-trimethoprim 800-160 MG tablet Commonly known as: BACTRIM DS Take 1 tablet by mouth 2 (two) times daily.      -will call with culture results, if positive -pt advised not to restart smoking marijuana for N/V or other reason as this can worsen symptoms of N/V, information given on cannabis hyperemesis -safe meds in pregnancy list given -pt advised to take medications around the clock and not to stop taking if feeling better -RX Phenergan suppositories prescribed if patient unable to take phenergan PO -discussed nonpharmacologic and pharmacologic treatments of N/V -discussed normal expectations for N/V in pregnancy -OB provider list given -return MAU precautions given -pt discharged to home in stable condition  Joni Reining E [employer] Student 05/31/2020, 8:54 PM

## 2020-06-01 LAB — CULTURE, OB URINE

## 2020-07-26 ENCOUNTER — Other Ambulatory Visit: Payer: Self-pay

## 2020-07-26 ENCOUNTER — Ambulatory Visit
Admission: EM | Admit: 2020-07-26 | Discharge: 2020-07-26 | Disposition: A | Discharge status: Home / Self Care | Payer: Medicaid Other

## 2020-07-26 DIAGNOSIS — Z3A15 15 weeks gestation of pregnancy: Secondary | ICD-10-CM | POA: Diagnosis not present

## 2020-07-26 DIAGNOSIS — R519 Headache, unspecified: Secondary | ICD-10-CM

## 2020-07-26 NOTE — ED Triage Notes (Signed)
Pt states she has been having migraines for about 2 days and is [redacted] weeks pregnant. Pt is aox4 and ambulatory.

## 2020-07-26 NOTE — Discharge Instructions (Signed)
Follow up with OBGYN 60-80 ounces water daily Tylenol for headache May try benadryl

## 2020-07-29 NOTE — ED Provider Notes (Signed)
[facility]    CSN: [id number] Arrival date & time: 07/26/20  1824      History   Chief Complaint Chief Complaint  Patient presents with   Headache    x 2 days    HPI Marie Faulkner is a 24 y.o. female approximately [redacted] weeks pregnant presenting today for evaluation of a headache.  Patient reports over the past 2 to 3 days she has had a headache which has waxed and waned.  Denies any significant history of headaches.  She reports overall headache intensity is improving, but still persistent.  She denies any associated vision changes.  Denies nausea or vomiting besides her normal morning sickness which has overall also been improving.  She has not been able to get set up with OB/GYN yet.  She is using Tylenol 500 mg without relief of symptoms.  She denies associated URI symptoms.  Denies fevers or neck stiffness.  HPI  Past Medical History:  Diagnosis Date   Medical history non-contributory     There are no problems to display for this patient.   Past Surgical History:  Procedure Laterality Date   NO PAST SURGERIES      OB History    Gravida  2   Para      Term      Preterm      AB  1   Living        SAB      TAB  1   Ectopic      Multiple      Live Births               Home Medications    Prior to Admission medications   Medication Sig Start Date End Date Taking? Authorizing Provider  famotidine (PEPCID) 20 MG tablet Take 1 tablet (20 mg total) by mouth daily. 05/31/20 05/31/21  Nugent, Odie Sera, NP  Multiple Vitamin (MULTIVITAMIN) capsule Take 1 capsule by mouth daily. 07/20/19   Federico Flake, MD  promethazine (PHENERGAN) 12.5 MG suppository Place 1 suppository (12.5 mg total) rectally every 6 (six) hours as needed for nausea or vomiting. 05/31/20 05/31/21  Nugent, Odie Sera, NP  promethazine (PHENERGAN) 12.5 MG tablet Take 1 tablet (12.5 mg total) by mouth every 6 (six) hours as needed for nausea or vomiting. 05/31/20    Nugent, Odie Sera, NP  sulfamethoxazole-trimethoprim (BACTRIM DS) 800-160 MG tablet Take 1 tablet by mouth 2 (two) times daily. Patient not taking: Reported on 07/20/2019 05/10/19   Joni Reining, PA-C    Family History History reviewed. No pertinent family history.  Social History Social History   Tobacco Use   Smoking status: Never Smoker   Smokeless tobacco: Never Used  Building services engineer Use: Never used  Substance Use Topics   Alcohol use: No   Drug use: Not Currently     Allergies   Nickel   Review of Systems Review of Systems  Constitutional: Negative for fatigue and fever.  HENT: Negative for congestion, sinus pressure and sore throat.   Eyes: Negative for photophobia, pain and visual disturbance.  Respiratory: Negative for cough and shortness of breath.   Cardiovascular: Negative for chest pain.  Gastrointestinal: Negative for abdominal pain, nausea and vomiting.  Genitourinary: Negative for decreased urine volume and hematuria.  Musculoskeletal: Negative for myalgias, neck pain and neck stiffness.  Neurological: Positive for headaches. Negative for dizziness, syncope, facial asymmetry, speech difficulty, weakness, light-headedness and numbness.  Physical Exam Triage Vital Signs ED Triage Vitals  Enc Vitals Group     BP 07/26/20 1913 106/62     Pulse Rate 07/26/20 1913 99     Resp 07/26/20 1913 17     Temp 07/26/20 1913 98.5 F (36.9 C)     Temp Source 07/26/20 1913 Oral     SpO2 07/26/20 1913 100 %     Weight --      Height --      Head Circumference --      Peak Flow --      Pain Score 07/26/20 1917 2     Pain Loc --      Pain Edu? --      Excl. in GC? --    No data found.  Updated Vital Signs BP 106/62 (BP Location: Left Arm)    Pulse 99    Temp 98.5 F (36.9 C) (Oral)    Resp 17    LMP 03/29/2020 (Approximate)    SpO2 100%   Visual Acuity Right Eye Distance:   Left Eye Distance:   Bilateral Distance:    Right Eye Near:   Left  Eye Near:    Bilateral Near:     Physical Exam Vitals and nursing note reviewed.  Constitutional:      Appearance: She is well-developed.     Comments: No acute distress  HENT:     Head: Normocephalic and atraumatic.     Ears:     Comments: Bilateral ears without tenderness to palpation of external auricle, tragus and mastoid, EAC's without erythema or swelling, TM's with good bony landmarks and cone of light. Non erythematous.     Nose: Nose normal.     Mouth/Throat:     Comments: Oral mucosa pink and moist, no tonsillar enlargement or exudate. Posterior pharynx patent and nonerythematous, no uvula deviation or swelling. Normal phonation. Eyes:     Extraocular Movements: Extraocular movements intact.     Conjunctiva/sclera: Conjunctivae normal.     Pupils: Pupils are equal, round, and reactive to light.     Comments: No nystagmus  Cardiovascular:     Rate and Rhythm: Normal rate.  Pulmonary:     Effort: Pulmonary effort is normal. No respiratory distress.     Comments: Breathing comfortably at rest, CTABL, no wheezing, rales or other adventitious sounds auscultated  Abdominal:     General: There is no distension.  Musculoskeletal:        General: Normal range of motion.     Cervical back: Neck supple.  Skin:    General: Skin is warm and dry.  Neurological:     General: No focal deficit present.     Mental Status: She is alert and oriented to person, place, and time. Mental status is at baseline.     Cranial Nerves: No cranial nerve deficit.     Motor: No weakness.     Gait: Gait normal.      UC Treatments / Results  Labs (all labs ordered are listed, but only abnormal results are displayed) Labs Reviewed - No data to display  EKG   Radiology No results found.  Procedures Procedures (including critical care time)  Medications Ordered in UC Medications - No data to display  Initial Impression / Assessment and Plan / UC Course  I have reviewed the triage  vital signs and the nursing notes.  Pertinent labs & imaging results that were available during my care of the patient were reviewed  by me and considered in my medical decision making (see chart for details).     Acute headache in setting of pregnancy.  No red flags, overall gradually improving.  Recommended to stick with Tylenol, may supplement with Benadryl as needed.  Continue to stay hydrated and drink plenty of fluids.  Follow-up with OB/GYN.  Discussed strict return precautions. Patient verbalized understanding and is agreeable with plan.  Final Clinical Impressions(s) / UC Diagnoses   Final diagnoses:  Acute nonintractable headache, unspecified headache type  [redacted] weeks gestation of pregnancy     Discharge Instructions     Follow up with OBGYN 60-80 ounces water daily Tylenol for headache May try benadryl   ED Prescriptions    None     PDMP not reviewed this encounter.   Lew Dawes, PA-C 07/29/20 1114

## 2020-11-03 ENCOUNTER — Inpatient Hospital Stay (HOSPITAL_COMMUNITY)
Admission: AD | Admit: 2020-11-03 | Discharge: 2020-11-03 | Disposition: A | Discharge status: Home / Self Care | Payer: Medicaid Other | Attending: Obstetrics & Gynecology | Admitting: Obstetrics & Gynecology

## 2020-11-03 ENCOUNTER — Other Ambulatory Visit: Payer: Self-pay

## 2020-11-03 DIAGNOSIS — Z20822 Contact with and (suspected) exposure to covid-19: Secondary | ICD-10-CM

## 2020-11-03 DIAGNOSIS — U071 COVID-19: Secondary | ICD-10-CM | POA: Diagnosis not present

## 2020-11-03 DIAGNOSIS — Z3A29 29 weeks gestation of pregnancy: Secondary | ICD-10-CM

## 2020-11-03 DIAGNOSIS — O98513 Other viral diseases complicating pregnancy, third trimester: Secondary | ICD-10-CM | POA: Diagnosis not present

## 2020-11-03 DIAGNOSIS — O368130 Decreased fetal movements, third trimester, not applicable or unspecified: Secondary | ICD-10-CM

## 2020-11-03 LAB — SARS CORONAVIRUS 2 (TAT 6-24 HRS): SARS Coronavirus 2: POSITIVE — AB

## 2020-11-03 NOTE — MAU Note (Addendum)
Pt reports to mau with c/o DFM since Monday. Pt states she felt baby move 5 times in 1 hour today but that the movement has "seemed less than normal".  Pt now feeling some movement in mau.  Pt also reports coughing up mucous for the past 2 days.  Grandfather tested + for covid 3 days ago.  Pt denies vag bleeding or LOF.  Denies any pain at this time.

## 2020-11-03 NOTE — MAU Provider Note (Signed)
Chief Complaint:  Decreased Fetal Movement and Cough  HPI: Marie Faulkner is a 25 y.o. G2P0010 at [redacted]w[redacted]d by 6 week ultrasound who presents to maternity admissions reporting concern of decreased fetal movement and productive cough. Specifically, pt reports "baby moving less than normal" since 10/30/19. She does report that her baby has moved 5 times in the last hour. Pt reports that she presented today given a new productive cough that started 2 days ago in the setting of her grandfather testing positive for COVID. Pt reports that she currently lives with her grandfather.  No medications other than her prenatal vitamins. No diabetes or HTN in pregnancy. She currently receives her prenatal care at [facility] in [place] with plans to delivery at [facility]. She has an ultrasound scheduled for 1/20 and her next clinic appt on 1/29.  She denies LOF, vaginal bleeding, vaginal itching/burning, urinary symptoms, h/a, dizziness, n/v, or fever/chills.  No chest pain, difficulty breathing or shortness of breath.  Past Medical History: Past Medical History:  Diagnosis Date  . Medical history non-contributory     Past obstetric history: OB History  Gravida Para Term Preterm AB Living  2       1    SAB IAB Ectopic Multiple Live Births    1          # Outcome Date GA Lbr Len/2nd Weight Sex Delivery Anes PTL Lv  2 Current           1 IAB 2015            Past Surgical History: Past Surgical History:  Procedure Laterality Date  . NO PAST SURGERIES      Family History: History reviewed. No pertinent family history.  Social History: Social History   Tobacco Use  . Smoking status: Never Smoker  . Smokeless tobacco: Never Used  Vaping Use  . Vaping Use: Never used  Substance Use Topics  . Alcohol use: No  . Drug use: Not Currently    Allergies:  Allergies  Allergen Reactions  . Nickel Itching and Rash    Meds:  Medications Prior to Admission  Medication Sig Dispense Refill Last Dose   . famotidine (PEPCID) 20 MG tablet Take 1 tablet (20 mg total) by mouth daily. 30 tablet 1   . Multiple Vitamin (MULTIVITAMIN) capsule Take 1 capsule by mouth daily. 100 capsule 0   . promethazine (PHENERGAN) 12.5 MG suppository Place 1 suppository (12.5 mg total) rectally every 6 (six) hours as needed for nausea or vomiting. 12 suppository 1   . promethazine (PHENERGAN) 12.5 MG tablet Take 1 tablet (12.5 mg total) by mouth every 6 (six) hours as needed for nausea or vomiting. 30 tablet 2   . sulfamethoxazole-trimethoprim (BACTRIM DS) 800-160 MG tablet Take 1 tablet by mouth 2 (two) times daily. (Patient not taking: Reported on 07/20/2019) 20 tablet 0     ROS:  Review of Systems  Constitutional: Negative for chills, fatigue and fever.  HENT: Positive for congestion. Negative for sore throat.   Eyes: Negative for photophobia and visual disturbance.  Respiratory: Positive for cough. Negative for chest tightness, shortness of breath and wheezing.   Cardiovascular: Negative for chest pain, palpitations and leg swelling.  Gastrointestinal: Negative for abdominal pain, nausea and vomiting.  Genitourinary: Negative for dysuria, vaginal bleeding, vaginal discharge and vaginal pain.  Neurological: Negative for light-headedness and headaches.    I have reviewed patient's Past Medical Hx, Surgical Hx, Family Hx, Social Hx, medications and allergies.  Physical Exam   Patient Vitals for the past 24 hrs:  BP Temp Temp src Pulse Resp SpO2  11/03/20 1420 122/60 -- -- 87 -- 96 %  11/03/20 1410 -- -- -- -- -- 97 %  11/03/20 1400 -- -- -- -- -- 98 %  11/03/20 1350 -- -- -- -- -- 97 %  11/03/20 1330 95/66 -- -- 96 -- 95 %  11/03/20 1319 123/63 98.3 F (36.8 C) Oral (!) 109 17 96 %   Constitutional: Well-developed, well-nourished female in no acute distress. Sitting comfortably on stretcher. Cardiovascular: normal rate Respiratory: normal effort GI: Abd soft, non-tender, gravid appropriate for  gestational age.  MS: Extremities nontender, no edema, normal ROM Neurologic: Alert and oriented x 4.  PELVIC EXAM: deferred   FHT:  Baseline 125, moderate variability, accelerations present, no decelerations, reactive strip Contractions: none   Labs: No results found for this or any previous visit (from the past 24 hour(s)). --/--/O POS (08/12 2100)  Imaging:  No results found.  MAU Course/MDM: Orders Placed This Encounter  Procedures  . SARS CORONAVIRUS 2 (TAT 6-24 HRS) Nasopharyngeal Nasopharyngeal Swab  . Airborne and Contact precautions  . Discharge patient Discharge disposition: 01-Home or Self Care; Discharge patient date: 11/03/2020    No orders of the defined types were placed in this encounter.    NST reviewed Treatments in MAU included: none.   Pt discharge with strict return precautions for worsening COVID symptoms including chest pain, difficulty breathing, SOB, extreme fatigue and other concerns.  Assessment & Plan: 1. Exposure to confirmed case of COVID-19: Pt presents to MAU today given concern of known COVID exposure (grandfather who lives in her home tested positive 3 days ago). Pt reports productive cough that started 2 days ago but otherwise no other symptoms or red flag findings. Reassuringly, pt is afebrile with stable vital signs and normal O2 saturation on room air. No prior COVID vaccine. -instructed quarantine for 10 days s/p symptom onset -will call pt with results of COVID test--if positive will message for MAB -recommended COVID vaccine in pregnancy s/p quarantine period and symptom resolution -instructed pt to reschedule ultrasound at [facility] scheduled for 1/20 -strict return precautions as noted above  2. Decreased fetal movements in third trimester, single or unspecified fetus: Pt reports "less movement of baby" since 1/10. She reports that she presented to MAU today given concern of known COVID exposure. Reassuringly pt reports 5 fetal movements in past  hour with reactive NST in MAU today. -provided written info on kick counts & instructions to return if concern of recurrent DFM -plan to f/u with prenatal provider at [facility] on 1/29 as previously scheduled or sooner as needed for return precautions as noted    Allergies as of 11/03/2020      Reactions   Nickel Itching, Rash      Medication List    STOP taking these medications   sulfamethoxazole-trimethoprim 800-160 MG tablet Commonly known as: BACTRIM DS     TAKE these medications   famotidine 20 MG tablet Commonly known as: Pepcid Take 1 tablet (20 mg total) by mouth daily.   multivitamin capsule Take 1 capsule by mouth daily.   promethazine 12.5 MG suppository Commonly known as: Phenergan Place 1 suppository (12.5 mg total) rectally every 6 (six) hours as needed for nausea or vomiting. What changed: Another medication with the same name was removed. Continue taking this medication, and follow the directions you see here.       Vincenza Dail,  Skipper Cliche, MD OB Fellow, Faculty Practice 11/03/2020 2:31 PM

## 2020-11-03 NOTE — Discharge Instructions (Signed)
Given your known exposure to COVID, we recommend that you quarantine for 10 days from symptom onset until 11/12/20. Please call or return for re-evaluation if you develop difficulty breathing, shortness of breath, chest pain or other concerning symptoms.  We will contact you with your COVID test result. If you are positive, we will message our team to schedule an infusion of monoclonal antibodies if this treatment is available.  You will need to reschedule your ultrasound appointment with [facility] given your known COVID exposure.  Fetal Movement Counts Patient Name: ________________________________________________ Patient Due Date: ____________________  What is a fetal movement count? A fetal movement count is the number of times that you feel your baby move during a certain amount of time. This may also be called a fetal kick count. A fetal movement count is recommended for every pregnant woman. You may be asked to start counting fetal movements as early as week 28 of your pregnancy. Pay attention to when your baby is most active. You may notice your baby's sleep and wake cycles. You may also notice things that make your baby move more. You should do a fetal movement count:  When your baby is normally most active.  At the same time each day. A good time to count movements is while you are resting, after having something to eat and drink. How do I count fetal movements? 1. Find a quiet, comfortable area. Sit, or lie down on your side. 2. Write down the date, the start time and stop time, and the number of movements that you felt between those two times. Take this information with you to your health care visits. 3. Write down your start time when you feel the first movement. 4. Count kicks, flutters, swishes, rolls, and jabs. You should feel at least 10 movements. 5. You may stop counting after you have felt 10 movements, or if you have been counting for 2 hours. Write down the stop time. 6. If you  do not feel 10 movements in 2 hours, contact your health care provider for further instructions. Your health care provider may want to do additional tests to assess your baby's well-being. Contact a health care provider if:  You feel fewer than 10 movements in 2 hours.  Your baby is not moving like he or she usually does. Date: ____________ Start time: ____________ Stop time: ____________ Movements: ____________ Date: ____________ Start time: ____________ Stop time: ____________ Movements: ____________ Date: ____________ Start time: ____________ Stop time: ____________ Movements: ____________ Date: ____________ Start time: ____________ Stop time: ____________ Movements: ____________ Date: ____________ Start time: ____________ Stop time: ____________ Movements: ____________ Date: ____________ Start time: ____________ Stop time: ____________ Movements: ____________ Date: ____________ Start time: ____________ Stop time: ____________ Movements: ____________ Date: ____________ Start time: ____________ Stop time: ____________ Movements: ____________ Date: ____________ Start time: ____________ Stop time: ____________ Movements: ____________ This information is not intended to replace advice given to you by your health care provider. Make sure you discuss any questions you have with your health care provider. Document Revised: 05/26/2019 Document Reviewed: 05/26/2019 Elsevier Patient Education  2021 [employer].

## 2021-03-13 ENCOUNTER — Other Ambulatory Visit: Payer: Self-pay

## 2021-03-13 ENCOUNTER — Encounter: Payer: Self-pay | Admitting: Emergency Medicine

## 2021-03-13 ENCOUNTER — Emergency Department
Admission: EM | Admit: 2021-03-13 | Discharge: 2021-03-13 | Disposition: A | Discharge status: Home / Self Care | Payer: Medicaid Other | Attending: Emergency Medicine | Admitting: Emergency Medicine

## 2021-03-13 DIAGNOSIS — H1032 Unspecified acute conjunctivitis, left eye: Secondary | ICD-10-CM | POA: Insufficient documentation

## 2021-03-13 DIAGNOSIS — H5711 Ocular pain, right eye: Secondary | ICD-10-CM | POA: Diagnosis present

## 2021-03-13 MED ORDER — OLOPATADINE HCL 0.1 % OP SOLN: 1.0000 [drp] | Freq: Two times a day (BID) | OPHTHALMIC | 12 refills | Status: AC

## 2021-03-13 NOTE — ED Provider Notes (Signed)
[facility]-EMERGENCY DEPARTMENT  ____________________________________________  Time seen: Approximately 10:25 PM  I have reviewed the triage vital signs and the nursing notes.   HISTORY  Chief Complaint Eye Problem   Historian Patient    HPI Marie Faulkner is a 25 y.o. female presents to the emergency department with concern for allergic conjunctivitis.  Patient was dying her hair earlier in the night and had right eye itching and mucus-like strings coming from the right eye.  Patient became concerned and wanted to be "checked out".  Patient denies blurry vision.  No fever or chills.  No similar symptoms in the past.   Past Medical History:  Diagnosis Date  . Medical history non-contributory      Immunizations up to date:  Yes.     Past Medical History:  Diagnosis Date  . Medical history non-contributory     There are no problems to display for this patient.   Past Surgical History:  Procedure Laterality Date  . NO PAST SURGERIES      Prior to Admission medications   Medication Sig Start Date End Date Taking? Authorizing Provider  olopatadine (PATADAY) 0.1 % ophthalmic solution Place 1 drop into the right eye 2 (two) times daily for 7 days. 03/13/21 03/20/21 Yes Pia Mau M, PA-C  famotidine (PEPCID) 20 MG tablet Take 1 tablet (20 mg total) by mouth daily. 05/31/20 05/31/21  Nugent, Odie Sera, NP  Multiple Vitamin (MULTIVITAMIN) capsule Take 1 capsule by mouth daily. 07/20/19   Federico Flake, MD  promethazine (PHENERGAN) 12.5 MG suppository Place 1 suppository (12.5 mg total) rectally every 6 (six) hours as needed for nausea or vomiting. 05/31/20 05/31/21  Nugent, Odie Sera, NP    Allergies Nickel  No family history on file.  Social History Social History   Tobacco Use  . Smoking status: Never Smoker  . Smokeless tobacco: Never Used  Vaping Use  . Vaping Use: Never used  Substance Use Topics  . Alcohol use: No  . Drug use: Not Currently      Review of Systems  Constitutional: No fever/chills Eyes: Patient has allergic conjunctivitis.  ENT: No upper respiratory complaints. Respiratory: no cough. No SOB/ use of accessory muscles to breath Gastrointestinal:   No nausea, no vomiting.  No diarrhea.  No constipation. Musculoskeletal: Negative for musculoskeletal pain. Skin: Negative for rash, abrasions, lacerations, ecchymosis.    ____________________________________________   PHYSICAL EXAM:  VITAL SIGNS: ED Triage Vitals  Enc Vitals Group     BP 03/13/21 2036 130/77     Pulse Rate 03/13/21 2036 75     Resp 03/13/21 2036 16     Temp 03/13/21 2036 98.5 F (36.9 C)     Temp Source 03/13/21 2036 Oral     SpO2 03/13/21 2036 97 %     Weight 03/13/21 2037 208 lb (94.3 kg)     Height 03/13/21 2037 5' 2.5" (1.588 m)     Head Circumference --      Peak Flow --      Pain Score 03/13/21 2035 0     Pain Loc --      Pain Edu? --      Excl. in GC? --      Constitutional: Alert and oriented. Well appearing and in no acute distress. Eyes: Conjunctivae are normal. PERRL. EOMI. Head: Atraumatic. ENT:      Nose: No congestion/rhinnorhea.      Mouth/Throat: Mucous membranes are moist.  Neck: No stridor.  No cervical spine tenderness to  palpation. Cardiovascular: Normal rate, regular rhythm. Normal S1 and S2.  Good peripheral circulation. Respiratory: Normal respiratory effort without tachypnea or retractions. Lungs CTAB. Good air entry to the bases with no decreased or absent breath sounds Gastrointestinal: Bowel sounds x 4 quadrants. Soft and nontender to palpation. No guarding or rigidity. No distention. Musculoskeletal: Full range of motion to all extremities. No obvious deformities noted Neurologic:  Normal for age. No gross focal neurologic deficits are appreciated.  Skin:  Skin is warm, dry and intact. No rash noted. Psychiatric: Mood and affect are normal for age. Speech and behavior are normal.    ____________________________________________   LABS (all labs ordered are listed, but only abnormal results are displayed)  Labs Reviewed - No data to display ____________________________________________  EKG   ____________________________________________  RADIOLOGY   No results found.  ____________________________________________    PROCEDURES  Procedure(s) performed:     Procedures     Medications - No data to display   ____________________________________________   INITIAL IMPRESSION / ASSESSMENT AND PLAN / ED COURSE  Pertinent labs & imaging results that were available during my care of the patient were reviewed by me and considered in my medical decision making (see chart for details).      Assessment and plan Conjunctivitis 25 year old female presents to the emergency department with left eye chemosis secondary to hair dye.  Recommended Benadryl every 6 hours until symptoms resolve and Pataday topically for the next 2 to 3 days.     ____________________________________________  FINAL CLINICAL IMPRESSION(S) / ED DIAGNOSES  Final diagnoses:  Acute conjunctivitis of left eye, unspecified acute conjunctivitis type      NEW MEDICATIONS STARTED DURING THIS VISIT:  ED Discharge Orders         Ordered    olopatadine (PATADAY) 0.1 % ophthalmic solution  2 times daily        03/13/21 2218              This chart was dictated using voice recognition software/Dragon. Despite best efforts to proofread, errors can occur which can change the meaning. Any change was purely unintentional.     Orvil Feil, PA-C 03/13/21 2229    Dionne Bucy, MD 03/13/21 2236

## 2021-03-13 NOTE — ED Triage Notes (Signed)
Pt arrived via POV with reports of R eye itching for the past hour after washing out hair dye and shampoo.  Pt states she notices white-stringy substance to eyes.  Denies any pain at this time.

## 2021-06-20 ENCOUNTER — Other Ambulatory Visit: Payer: Self-pay

## 2021-06-20 ENCOUNTER — Emergency Department
Admission: EM | Admit: 2021-06-20 | Discharge: 2021-06-20 | Disposition: A | Discharge status: Home / Self Care | Payer: Medicaid Other | Attending: Emergency Medicine | Admitting: Emergency Medicine

## 2021-06-20 DIAGNOSIS — Z20822 Contact with and (suspected) exposure to covid-19: Secondary | ICD-10-CM | POA: Insufficient documentation

## 2021-06-20 DIAGNOSIS — R059 Cough, unspecified: Secondary | ICD-10-CM | POA: Diagnosis present

## 2021-06-20 DIAGNOSIS — J069 Acute upper respiratory infection, unspecified: Secondary | ICD-10-CM | POA: Diagnosis not present

## 2021-06-20 LAB — RESP PANEL BY RT-PCR (FLU A&B, COVID) ARPGX2
Influenza A by PCR: NEGATIVE
Influenza B by PCR: NEGATIVE
SARS Coronavirus 2 by RT PCR: NEGATIVE

## 2021-06-20 NOTE — ED Provider Notes (Signed)
[facility] Emergency Department Provider Note  ____________________________________________   Event Date/Time   First MD Initiated Contact with Patient 06/20/21 1034     (approximate)  I have reviewed the triage vital signs and the nursing notes.   HISTORY  Chief Complaint Cough    HPI Marie Faulkner is a 25 y.o. female presents to the emergency department with COVID-like symptoms.  Patient's had cough congestion and sinus drainage for 2 to 3 days.  No fever or chills.  No chest pain or shortness of breath.  Her child is also sick.  Past Medical History:  Diagnosis Date   Medical history non-contributory     There are no problems to display for this patient.   Past Surgical History:  Procedure Laterality Date   NO PAST SURGERIES      Prior to Admission medications   Medication Sig Start Date End Date Taking? Authorizing Provider  famotidine (PEPCID) 20 MG tablet Take 1 tablet (20 mg total) by mouth daily. 05/31/20 05/31/21  Nugent, Odie Sera, NP  Multiple Vitamin (MULTIVITAMIN) capsule Take 1 capsule by mouth daily. 07/20/19   Federico Flake, MD    Allergies Nickel  No family history on file.  Social History Social History   Tobacco Use   Smoking status: Never   Smokeless tobacco: Never  Vaping Use   Vaping Use: Never used  Substance Use Topics   Alcohol use: No   Drug use: Not Currently    Review of Systems  Constitutional: No fever/chills Eyes: No visual changes. ENT: No sore throat.  Positive runny nose and congestion Respiratory: Positive cough Cardiovascular: Denies chest pain Gastrointestinal: Denies abdominal pain Genitourinary: Negative for dysuria. Musculoskeletal: Negative for back pain. Skin: Negative for rash. Psychiatric: no mood changes,     ____________________________________________   PHYSICAL EXAM:  VITAL SIGNS: ED Triage Vitals  Enc Vitals Group     BP 06/20/21 1035 127/76     Pulse  Rate 06/20/21 1035 82     Resp 06/20/21 1035 18     Temp 06/20/21 1035 98 F (36.7 C)     Temp Source 06/20/21 1035 Oral     SpO2 06/20/21 1035 98 %     Weight 06/20/21 1016 207 lb 14.3 oz (94.3 kg)     Height 06/20/21 1016 5\' 2"  (1.575 m)     Head Circumference --      Peak Flow --      Pain Score --      Pain Loc --      Pain Edu? --      Excl. in GC? --     Constitutional: Alert and oriented. Well appearing and in no acute distress. Eyes: Conjunctivae are normal.  Head: Atraumatic. Ears: Left TM is slightly pink and dull Nose: No congestion/rhinnorhea. Mouth/Throat: Mucous membranes are moist.   Neck:  supple no lymphadenopathy noted Cardiovascular: Normal rate, regular rhythm. Heart sounds are normal Respiratory: Normal respiratory effort.  No retractions, lungs c t a  GU: deferred Musculoskeletal: FROM all extremities, warm and well perfused Neurologic:  Normal speech and language.  Skin:  Skin is warm, dry and intact. No rash noted. Psychiatric: Mood and affect are normal. Speech and behavior are normal.  ____________________________________________   LABS (all labs ordered are listed, but only abnormal results are displayed)  Labs Reviewed  RESP PANEL BY RT-PCR (FLU A&B, COVID) ARPGX2   ____________________________________________   ____________________________________________  RADIOLOGY    ____________________________________________   PROCEDURES  Procedure(s) performed: No  Procedures    ____________________________________________   INITIAL IMPRESSION / ASSESSMENT AND PLAN / ED COURSE  Pertinent labs & imaging results that were available during my care of the patient were reviewed by me and considered in my medical decision making (see chart for details).   Patient is a 25 year old female presents emergency department with URI symptoms.  See HPI.  Physical exam shows patient be stable  Covid/flu test are pending  Respiratory panel is  negative.  Patient is to follow-up with her regular doctor if not improving.  Take over-the-counter allergy medications.  Return emergency department worsening.  She is discharged stable condition.     Marie Faulkner was evaluated in Emergency Department on 06/20/2021 for the symptoms described in the history of present illness. She was evaluated in the context of the global COVID-19 pandemic, which necessitated consideration that the patient might be at risk for infection with the SARS-CoV-2 virus that causes COVID-19. Institutional protocols and algorithms that pertain to the evaluation of patients at risk for COVID-19 are in a state of rapid change based on information released by regulatory bodies including the CDC and federal and state organizations. These policies and algorithms were followed during the patient's care in the ED.    As part of my medical decision making, I reviewed the following data within the electronic MEDICAL RECORD NUMBER Nursing notes reviewed and incorporated, Labs reviewed , Old chart reviewed, Notes from prior ED visits, and [place] Controlled Substance Database  ____________________________________________   FINAL CLINICAL IMPRESSION(S) / ED DIAGNOSES  Final diagnoses:  Acute URI      NEW MEDICATIONS STARTED DURING THIS VISIT:  Discharge Medication List as of 06/20/2021 10:51 AM       Note:  This document was prepared using Dragon voice recognition software and may include unintentional dictation errors.    Faythe Ghee, PA-C 06/20/21 1414    Minna Antis, MD 06/20/21 1447

## 2021-06-20 NOTE — ED Triage Notes (Signed)
Pt c/o cough for the past several days. States she just wants to be checked out

## 2021-06-20 NOTE — ED Notes (Signed)
See triage note  Presents with cough for couple of days  No fever or other sxs'

## 2021-06-20 NOTE — Discharge Instructions (Addendum)
If your covid test is postive, follow these instructions  Take over-the-counter vitamin C, vitamin D, and zinc to help boost your immune system Take over-the-counter Mucinex for cough as needed Take over-the-counter Tylenol and ibuprofen for fever and body aches Follow-up with your regular doctor as needed Return emergency department worsening Quarantine for 5 days if you are vaccinated and 10 days if you are not.  Anne Shutter is from onset of symptoms.  At that time you may return to school/work if your symptoms have improved and you have been fever free for 24 hours.

## 2021-07-26 ENCOUNTER — Emergency Department
Admission: EM | Admit: 2021-07-26 | Discharge: 2021-07-26 | Disposition: A | Discharge status: Home / Self Care | Payer: Medicaid Other | Attending: Emergency Medicine | Admitting: Emergency Medicine

## 2021-07-26 ENCOUNTER — Other Ambulatory Visit: Payer: Self-pay

## 2021-07-26 ENCOUNTER — Encounter: Payer: Self-pay | Admitting: Emergency Medicine

## 2021-07-26 DIAGNOSIS — J029 Acute pharyngitis, unspecified: Secondary | ICD-10-CM | POA: Diagnosis present

## 2021-07-26 LAB — GROUP A STREP BY PCR: Group A Strep by PCR: NOT DETECTED

## 2021-07-26 NOTE — ED Provider Notes (Signed)
[facility]-EMERGENCY DEPARTMENT  ____________________________________________  Time seen: Approximately 11:21 PM  I have reviewed the triage vital signs and the nursing notes.   HISTORY  Chief Complaint Sore Throat   Historian Patient     HPI Marie Faulkner is a 25 y.o. female presents to the emergency department with pharyngitis that started this morning.  No rhinorrhea, nasal congestion or nonproductive cough.  No difficulty swallowing.  No pain underneath the tongue.  No chest pain, chest tightness or abdominal pain.   Past Medical History:  Diagnosis Date   Medical history non-contributory      Immunizations up to date:  Yes.     Past Medical History:  Diagnosis Date   Medical history non-contributory     There are no problems to display for this patient.   Past Surgical History:  Procedure Laterality Date   NO PAST SURGERIES      Prior to Admission medications   Medication Sig Start Date End Date Taking? Authorizing Provider  famotidine (PEPCID) 20 MG tablet Take 1 tablet (20 mg total) by mouth daily. 05/31/20 05/31/21  Nugent, Odie Sera, NP  Multiple Vitamin (MULTIVITAMIN) capsule Take 1 capsule by mouth daily. 07/20/19   Federico Flake, MD    Allergies Nickel  No family history on file.  Social History Social History   Tobacco Use   Smoking status: Never   Smokeless tobacco: Never  Vaping Use   Vaping Use: Never used  Substance Use Topics   Alcohol use: No   Drug use: Not Currently     Review of Systems  Constitutional: No fever/chills Eyes:  No discharge ENT: Patient has pharyngitis.  Respiratory: no cough. No SOB/ use of accessory muscles to breath Gastrointestinal:   No nausea, no vomiting.  No diarrhea.  No constipation. Musculoskeletal: Negative for musculoskeletal pain. Skin: Negative for rash, abrasions, lacerations, ecchymosis.    ____________________________________________   PHYSICAL EXAM:  VITAL SIGNS: ED Triage  Vitals  Enc Vitals Group     BP 07/26/21 2001 122/71     Pulse Rate 07/26/21 2001 95     Resp 07/26/21 2001 16     Temp 07/26/21 2001 98.8 F (37.1 C)     Temp Source 07/26/21 2001 Oral     SpO2 07/26/21 2001 98 %     Weight 07/26/21 2000 195 lb (88.5 kg)     Height 07/26/21 2000 5' 2.5" (1.588 m)     Head Circumference --      Peak Flow --      Pain Score 07/26/21 2009 8     Pain Loc --      Pain Edu? --      Excl. in GC? --     Constitutional: Alert and oriented. Patient is lying supine. Eyes: Conjunctivae are normal. PERRL. EOMI. Head: Atraumatic. ENT:      Ears: Tympanic membranes are mildly injected with mild effusion bilaterally.       Nose: No congestion/rhinnorhea.      Mouth/Throat: Mucous membranes are moist. Posterior pharynx is mildly erythematous.  Hematological/Lymphatic/Immunilogical: No cervical lymphadenopathy.  Cardiovascular: Normal rate, regular rhythm. Normal S1 and S2.  Good peripheral circulation. Respiratory: Normal respiratory effort without tachypnea or retractions. Lungs CTAB. Good air entry to the bases with no decreased or absent breath sounds. Gastrointestinal: Bowel sounds 4 quadrants. Soft and nontender to palpation. No guarding or rigidity. No palpable masses. No distention. No CVA tenderness. Musculoskeletal: Full range of motion to all extremities. No gross deformities appreciated.  Neurologic:  Normal speech and language. No gross focal neurologic deficits are appreciated.  Skin:  Skin is warm, dry and intact. No rash noted. Psychiatric: Mood and affect are normal. Speech and behavior are normal. Patient exhibits appropriate insight and judgement.   ____________________________________________   LABS (all labs ordered are listed, but only abnormal results are displayed)  Labs Reviewed  GROUP A STREP BY PCR   ____________________________________________  EKG   ____________________________________________  RADIOLOGY   No results  found.  ____________________________________________    PROCEDURES  Procedure(s) performed:     Procedures     Medications - No data to display   ____________________________________________   INITIAL IMPRESSION / ASSESSMENT AND PLAN / ED COURSE  Pertinent labs & imaging results that were available during my care of the patient were reviewed by me and considered in my medical decision making (see chart for details).      Assessment and plan Pharyngitis 25 year old female presents to the emergency department with pharyngitis for 1 day.  Group A strep testing was negative.  Recommended Tylenol and ibuprofen alternating for likely viral pharyngitis.  Return precautions were given to return with new or worsening symptoms.  All patient questions were answered.     ____________________________________________  FINAL CLINICAL IMPRESSION(S) / ED DIAGNOSES  Final diagnoses:  Pharyngitis, unspecified etiology      NEW MEDICATIONS STARTED DURING THIS VISIT:  ED Discharge Orders     None           This chart was dictated using voice recognition software/Dragon. Despite best efforts to proofread, errors can occur which can change the meaning. Any change was purely unintentional.     Orvil Feil, PA-C 07/26/21 2322    Minna Antis, MD 08/04/21 1705

## 2021-07-26 NOTE — ED Triage Notes (Signed)
Pt reports sore throat since this morning. Pt talks in complete sentences no distress noted

## 2021-07-26 NOTE — Discharge Instructions (Signed)
Take Tylenol and Ibuprofen alternating for pharyngitis.

## 2021-10-30 ENCOUNTER — Encounter: Payer: Self-pay | Admitting: Emergency Medicine

## 2021-10-30 ENCOUNTER — Emergency Department
Admission: EM | Admit: 2021-10-30 | Discharge: 2021-10-30 | Disposition: A | Discharge status: Home / Self Care | Payer: Medicaid Other | Attending: Emergency Medicine | Admitting: Emergency Medicine

## 2021-10-30 ENCOUNTER — Emergency Department: Payer: Medicaid Other

## 2021-10-30 ENCOUNTER — Other Ambulatory Visit: Payer: Self-pay

## 2021-10-30 DIAGNOSIS — R0789 Other chest pain: Secondary | ICD-10-CM | POA: Diagnosis not present

## 2021-10-30 DIAGNOSIS — R079 Chest pain, unspecified: Secondary | ICD-10-CM | POA: Diagnosis present

## 2021-10-30 LAB — CBC
HCT: 38.9 % (ref 36.0–46.0)
Hemoglobin: 12.6 g/dL (ref 12.0–15.0)
MCH: 28.4 pg (ref 26.0–34.0)
MCHC: 32.4 g/dL (ref 30.0–36.0)
MCV: 87.8 fL (ref 80.0–100.0)
Platelets: 222 10*3/uL (ref 150–400)
RBC: 4.43 MIL/uL (ref 3.87–5.11)
RDW: 14.1 % (ref 11.5–15.5)
WBC: 4.1 10*3/uL (ref 4.0–10.5)
nRBC: 0 % (ref 0.0–0.2)

## 2021-10-30 LAB — BASIC METABOLIC PANEL
Anion gap: 8 (ref 5–15)
BUN: 20 mg/dL (ref 6–20)
CO2: 24 mmol/L (ref 22–32)
Calcium: 9.1 mg/dL (ref 8.9–10.3)
Chloride: 106 mmol/L (ref 98–111)
Creatinine, Ser: 0.91 mg/dL (ref 0.44–1.00)
GFR, Estimated: >60 mL/min (ref >60)
Glucose, Bld: 93 mg/dL (ref 70–99)
Potassium: 3.6 mmol/L (ref 3.5–5.1)
Sodium: 138 mmol/L (ref 135–145)

## 2021-10-30 LAB — TROPONIN I (HIGH SENSITIVITY): Troponin I (High Sensitivity): <2 ng/L (ref <18)

## 2021-10-30 MED ORDER — NAPROXEN 500 MG PO TABS: 500.0000 mg | ORAL_TABLET | Freq: Two times a day (BID) | ORAL | 2 refills | Status: DC

## 2021-10-30 NOTE — ED Triage Notes (Signed)
Pt reports that she has left sided chest pain "for awhile" she feels that it is pressure and that it makes her left arm heavy. Denies N/V, SHOB , or diaphoresis. The pains are getting more frequent and that has her worried.

## 2021-10-30 NOTE — ED Provider Notes (Signed)
[facility] Provider Note    Event Date/Time   First MD Initiated Contact with Patient 10/30/21 1013     (approximate)   History   Chest Pain   HPI  Hiedi Sampley is a 26 y.o. female with no significant past medical history who presents with complaints of intermittent chest pain which is been occurring for several months.  Patient reports the pain is in her upper left chest and feels tight at times, and seems to be related to stress.  When she raises her left arm the pain feels better.  She does not have the pain today.  No fevers chills or cough.  No shortness of breath.  No pleurisy.  No back pain.  Has not take anything for this.     Physical Exam   Triage Vital Signs: ED Triage Vitals  Enc Vitals Group     BP 10/30/21 0959 127/84     Pulse Rate 10/30/21 0959 (!) 57     Resp 10/30/21 0959 20     Temp 10/30/21 0959 98.3 F (36.8 C)     Temp Source 10/30/21 0959 Oral     SpO2 10/30/21 0959 100 %     Weight 10/30/21 1000 85.3 kg (188 lb)     Height 10/30/21 1000 1.6 m (5\' 3" )     Head Circumference --      Peak Flow --      Pain Score 10/30/21 1000 8     Pain Loc --      Pain Edu? --      Excl. in GC? --     Most recent vital signs: Vitals:   10/30/21 0959  BP: 127/84  Pulse: (!) 57  Resp: 20  Temp: 98.3 F (36.8 C)  SpO2: 100%     General: Awake, no distress.  CV:  Good peripheral perfusion.  Normal chest wall exam, no tenderness palpation, regular rate and rhythm Resp:  Normal effort.  Clear to auscultation bilaterally Abd:  No distention.  Other:  No calf pain or swelling   ED Results / Procedures / Treatments   Labs (all labs ordered are listed, but only abnormal results are displayed) Labs Reviewed  BASIC METABOLIC PANEL  CBC  POC URINE PREG, ED  TROPONIN I (HIGH SENSITIVITY)  TROPONIN I (HIGH SENSITIVITY)     EKG  ED ECG REPORT I, 12/28/21, the attending physician, personally viewed and interpreted this  ECG.  Date: 10/30/2021  Rhythm: normal sinus rhythm QRS Axis: normal Intervals: normal ST/T Wave abnormalities: normal Narrative Interpretation: no evidence of acute ischemia    RADIOLOGY Chest x-ray reviewed by me, no acute abnormality    PROCEDURES:  Critical Care performed:   Procedures   MEDICATIONS ORDERED IN ED: Medications - No data to display   IMPRESSION / MDM / ASSESSMENT AND PLAN / ED COURSE  I reviewed the triage vital signs and the nursing notes.  Patient presents with chest discomfort intermittently over several months as detailed above.  Low risk for ACS, not consistent with PE or dissection.  Not consistent with myocarditis.  Suspect chest wall/muscle pain.  Lab work today is quite reassuring, high sensitive troponin is normal.  CBC and BMP are normal.  Chest x-ray without abnormality.  She is asymptomatic here in the emergency department.  We will trial her on NSAIDs, have referred her to cardiology for further evaluation          FINAL CLINICAL IMPRESSION(S) / ED  DIAGNOSES   Final diagnoses:  Chest wall pain     Rx / DC Orders   ED Discharge Orders          Ordered    naproxen (NAPROSYN) 500 MG tablet  2 times daily with meals        10/30/21 1108             Note:  This document was prepared using Dragon voice recognition software and may include unintentional dictation errors.   Jene Every, MD 10/30/21 [phone]

## 2021-11-15 ENCOUNTER — Emergency Department
Admission: EM | Admit: 2021-11-15 | Discharge: 2021-11-15 | Disposition: A | Discharge status: Home / Self Care | Payer: Medicaid Other | Attending: Emergency Medicine | Admitting: Emergency Medicine

## 2021-11-15 ENCOUNTER — Other Ambulatory Visit: Payer: Self-pay

## 2021-11-15 DIAGNOSIS — Z20822 Contact with and (suspected) exposure to covid-19: Secondary | ICD-10-CM | POA: Insufficient documentation

## 2021-11-15 DIAGNOSIS — J069 Acute upper respiratory infection, unspecified: Secondary | ICD-10-CM | POA: Diagnosis not present

## 2021-11-15 DIAGNOSIS — R059 Cough, unspecified: Secondary | ICD-10-CM | POA: Diagnosis present

## 2021-11-15 DIAGNOSIS — Z28310 Unvaccinated for covid-19: Secondary | ICD-10-CM | POA: Insufficient documentation

## 2021-11-15 LAB — RESP PANEL BY RT-PCR (FLU A&B, COVID) ARPGX2
Influenza A by PCR: NEGATIVE
Influenza B by PCR: NEGATIVE
SARS Coronavirus 2 by RT PCR: NEGATIVE

## 2021-11-15 NOTE — Discharge Instructions (Signed)
Follow-up with your regular doctor if not improving to 3 days.  Return emergency department worsening.  Your COVID/influenza test is negative.  You will not need to quarantine.

## 2021-11-15 NOTE — ED Provider Notes (Signed)
° °  [facility] Provider Note    Event Date/Time   First MD Initiated Contact with Patient 11/15/21 [phone]     (approximate)   History   Covid Exposure   HPI  Marie Faulkner is a 26 y.o. female presents emergency department with nasal congestion and cough.  Patient states that her son tested positive for COVID yesterday.  She herself has been sick for about 3 days.  Patient is not vaccinated for COVID but did have COVID approximately 1 year ago.  She denies any chest pain or shortness of breath.      Physical Exam   Triage Vital Signs: ED Triage Vitals  Enc Vitals Group     BP 11/15/21 0738 (!) 125/57     Pulse Rate 11/15/21 0738 68     Resp 11/15/21 0738 18     Temp 11/15/21 0738 98.1 F (36.7 C)     Temp Source 11/15/21 0738 Oral     SpO2 11/15/21 0738 100 %     Weight 11/15/21 0741 187 lb 13.3 oz (85.2 kg)     Height 11/15/21 0741 5\' 3"  (1.6 m)     Head Circumference --      Peak Flow --      Pain Score --      Pain Loc --      Pain Edu? --      Excl. in GC? --     Most recent vital signs: Vitals:   11/15/21 0738  BP: (!) 125/57  Pulse: 68  Resp: 18  Temp: 98.1 F (36.7 C)  SpO2: 100%     General: Awake, no distress.   CV:  Good peripheral perfusion. regular rate and  rhythm Resp:  Normal effort. Lungs CTA Abd:  No distention.   Other:  Patient sounds congested nasally   ED Results / Procedures / Treatments   Labs (all labs ordered are listed, but only abnormal results are displayed) Labs Reviewed  RESP PANEL BY RT-PCR (FLU A&B, COVID) ARPGX2     EKG     RADIOLOGY     PROCEDURES:   Procedures   MEDICATIONS ORDERED IN ED: Medications - No data to display   IMPRESSION / MDM / ASSESSMENT AND PLAN / ED COURSE  I reviewed the triage vital signs and the nursing notes.                              Differential diagnosis includes, but is not limited to, COVID, influenza, viral URI  Respiratory panel  obtained  Respiratory panel is negative for COVID and influenza.  I did explain these findings to the patient.  She can return to work tomorrow if she has a negative test.  However she is still feeling bad and continues to cough she should had a home COVID test to 3 days.  Return emergency department worsening.  Patient is agreement treatment plan.  Discharged stable condition with a work note         FINAL CLINICAL IMPRESSION(S) / ED DIAGNOSES   Final diagnoses:  Acute URI     Rx / DC Orders   ED Discharge Orders     None        Note:  This document was prepared using Dragon voice recognition software and may include unintentional dictation errors.    11/17/21, PA-C 11/15/21 11/17/21    [id number], MD 11/15/21 [phone]

## 2021-11-15 NOTE — ED Triage Notes (Signed)
Pt states her son tested positive for covid, pt is having sinus congestion for the past 3 days

## 2021-11-15 NOTE — ED Notes (Signed)
See triage note  presents with nasal congestion and cough  afebrile  has been exposed to COVID

## 2022-06-03 ENCOUNTER — Other Ambulatory Visit: Payer: Self-pay

## 2022-06-03 ENCOUNTER — Encounter (HOSPITAL_COMMUNITY): Payer: Self-pay | Admitting: Emergency Medicine

## 2022-06-03 ENCOUNTER — Emergency Department (HOSPITAL_COMMUNITY): Payer: No Typology Code available for payment source

## 2022-06-03 ENCOUNTER — Emergency Department (HOSPITAL_COMMUNITY)
Admission: EM | Admit: 2022-06-03 | Discharge: 2022-06-03 | Disposition: A | Discharge status: Home / Self Care | Payer: No Typology Code available for payment source | Attending: Emergency Medicine | Admitting: Emergency Medicine

## 2022-06-03 DIAGNOSIS — Y92410 Unspecified street and highway as the place of occurrence of the external cause: Secondary | ICD-10-CM | POA: Diagnosis not present

## 2022-06-03 DIAGNOSIS — M25561 Pain in right knee: Secondary | ICD-10-CM | POA: Insufficient documentation

## 2022-06-03 MED ORDER — MELOXICAM 7.5 MG PO TABS: 7.5000 mg | ORAL_TABLET | Freq: Every day | ORAL | 0 refills | Status: DC
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
## 2022-06-03 NOTE — ED Notes (Signed)
Ortho called for knee brace. 

## 2022-06-03 NOTE — Discharge Instructions (Signed)
We have given you a brace in the ER today to help manage your symptoms. I have also given you a prescription for an anti-inflammatory medication for you to take as prescribed as needed for management of pain. I also recommend that you rest, ice, compress, and elevate your knee to help with pain. I have given you a referral to orthopedics with a number to call to schedule an appointment for further evaluation and management of your symptoms.  Return if development of any new or worsening symptoms

## 2022-08-18 ENCOUNTER — Ambulatory Visit
Admission: EM | Admit: 2022-08-18 | Discharge: 2022-08-18 | Disposition: A | Discharge status: Home / Self Care | Payer: Self-pay

## 2022-08-18 DIAGNOSIS — S0990XA Unspecified injury of head, initial encounter: Secondary | ICD-10-CM

## 2022-08-18 NOTE — ED Notes (Addendum)
Patient is being discharged from the Urgent Care and sent to the Emergency Department via POv . Per L. Morgan-Scales PA-C , patient is in need of higher level of care due to need for further evaluation . Patient is aware and verbalizes understanding of plan of care.  Vitals:   08/18/22 1132  BP: 120/79  Pulse: 62  Resp: 16  Temp: 98.3 F (36.8 C)  SpO2: 97%
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
## 2022-09-07 ENCOUNTER — Inpatient Hospital Stay (HOSPITAL_COMMUNITY)
Admission: AD | Admit: 2022-09-07 | Discharge: 2022-09-07 | Disposition: A | Discharge status: Home / Self Care | Payer: Medicaid Other | Attending: Obstetrics & Gynecology | Admitting: Obstetrics & Gynecology

## 2022-09-07 ENCOUNTER — Encounter (HOSPITAL_COMMUNITY): Payer: Self-pay | Admitting: *Deleted

## 2022-09-07 DIAGNOSIS — O219 Vomiting of pregnancy, unspecified: Secondary | ICD-10-CM | POA: Diagnosis not present

## 2022-09-07 DIAGNOSIS — R42 Dizziness and giddiness: Secondary | ICD-10-CM | POA: Diagnosis present

## 2022-09-07 DIAGNOSIS — O211 Hyperemesis gravidarum with metabolic disturbance: Secondary | ICD-10-CM | POA: Insufficient documentation

## 2022-09-07 DIAGNOSIS — R1110 Vomiting, unspecified: Secondary | ICD-10-CM | POA: Diagnosis present

## 2022-09-07 DIAGNOSIS — Z3201 Encounter for pregnancy test, result positive: Secondary | ICD-10-CM | POA: Diagnosis present

## 2022-09-07 DIAGNOSIS — F12188 Cannabis abuse with other cannabis-induced disorder: Secondary | ICD-10-CM

## 2022-09-07 DIAGNOSIS — Z3A08 8 weeks gestation of pregnancy: Secondary | ICD-10-CM | POA: Diagnosis not present

## 2022-09-07 LAB — BASIC METABOLIC PANEL
Anion gap: 16 — ABNORMAL HIGH (ref 5–15)
BUN: 17 mg/dL (ref 6–20)
CO2: 18 mmol/L — ABNORMAL LOW (ref 22–32)
Calcium: 9.8 mg/dL (ref 8.9–10.3)
Chloride: 102 mmol/L (ref 98–111)
Creatinine, Ser: 0.88 mg/dL (ref 0.44–1.00)
GFR, Estimated: >60 mL/min (ref >60)
Glucose, Bld: 90 mg/dL (ref 70–99)
Potassium: 3 mmol/L — ABNORMAL LOW (ref 3.5–5.1)
Sodium: 136 mmol/L (ref 135–145)

## 2022-09-07 LAB — CBC WITH DIFFERENTIAL/PLATELET
Abs Immature Granulocytes: 0.03 10*3/uL (ref 0.00–0.07)
Basophils Absolute: 0 10*3/uL (ref 0.0–0.1)
Basophils Relative: 0 %
Eosinophils Absolute: 0 10*3/uL (ref 0.0–0.5)
Eosinophils Relative: 0 %
HCT: 40.2 % (ref 36.0–46.0)
Hemoglobin: 14 g/dL (ref 12.0–15.0)
Immature Granulocytes: 0 %
Lymphocytes Relative: 30 %
Lymphs Abs: 2 10*3/uL (ref 0.7–4.0)
MCH: 29.3 pg (ref 26.0–34.0)
MCHC: 34.8 g/dL (ref 30.0–36.0)
MCV: 84.1 fL (ref 80.0–100.0)
Monocytes Absolute: 0.5 10*3/uL (ref 0.1–1.0)
Monocytes Relative: 8 %
Neutro Abs: 4.2 10*3/uL (ref 1.7–7.7)
Neutrophils Relative %: 62 %
Platelets: 255 10*3/uL (ref 150–400)
RBC: 4.78 MIL/uL (ref 3.87–5.11)
RDW: 12.5 % (ref 11.5–15.5)
WBC: 6.8 10*3/uL (ref 4.0–10.5)
nRBC: 0 % (ref 0.0–0.2)

## 2022-09-07 LAB — URINALYSIS, ROUTINE W REFLEX MICROSCOPIC
Bacteria, UA: NONE SEEN
Bilirubin Urine: NEGATIVE
Glucose, UA: NEGATIVE mg/dL
Hgb urine dipstick: NEGATIVE
Ketones, ur: 80 mg/dL — AB
Leukocytes,Ua: NEGATIVE
Nitrite: NEGATIVE
Protein, ur: 100 mg/dL — AB
Specific Gravity, Urine: 1.036 — ABNORMAL HIGH (ref 1.005–1.030)
pH: 6 (ref 5.0–8.0)

## 2022-09-07 LAB — RAPID URINE DRUG SCREEN, HOSP PERFORMED
Amphetamines: NOT DETECTED
Barbiturates: NOT DETECTED
Benzodiazepines: NOT DETECTED
Cocaine: NOT DETECTED
Opiates: NOT DETECTED
Tetrahydrocannabinol: POSITIVE — AB

## 2022-09-07 LAB — HCG, QUANTITATIVE, PREGNANCY: hCG, Beta Chain, Quant, S: 145656 m[IU]/mL — ABNORMAL HIGH (ref <5)

## 2022-09-07 LAB — POCT PREGNANCY, URINE: Preg Test, Ur: POSITIVE — AB

## 2022-09-07 MED ORDER — DIPHENHYDRAMINE HCL 50 MG/ML IJ SOLN
25.0000 mg | Freq: Once | INTRAMUSCULAR | Status: AC
  Administered 2022-09-07: 25 mg via INTRAVENOUS
  Filled 2022-09-07: qty 1

## 2022-09-07 MED ORDER — ONDANSETRON HCL 4 MG/2ML IJ SOLN
4.0000 mg | Freq: Once | INTRAMUSCULAR | Status: AC
  Administered 2022-09-07: 4 mg via INTRAVENOUS
  Filled 2022-09-07: qty 2

## 2022-09-07 MED ORDER — LACTATED RINGERS IV BOLUS
1000.0000 mL | Freq: Once | INTRAVENOUS | Status: AC
  Administered 2022-09-07: 1000 mL via INTRAVENOUS

## 2022-09-07 MED ORDER — HALOPERIDOL LACTATE 5 MG/ML IJ SOLN
2.0000 mg | Freq: Once | INTRAMUSCULAR | Status: AC
  Administered 2022-09-07: 2 mg via INTRAVENOUS
  Filled 2022-09-07: qty 1

## 2022-09-07 MED ORDER — PREPLUS 27-1 MG PO TABS: 1.0000 | ORAL_TABLET | Freq: Every day | ORAL | 13 refills | Status: AC

## 2022-09-07 MED ORDER — POTASSIUM CHLORIDE ER 10 MEQ PO TBCR: 10.0000 meq | EXTENDED_RELEASE_TABLET | Freq: Every day | ORAL | 0 refills | Status: AC

## 2022-09-07 MED ORDER — PROMETHAZINE HCL 12.5 MG PO TABS: 12.5000 mg | ORAL_TABLET | Freq: Four times a day (QID) | ORAL | 0 refills | Status: AC | PRN

## 2022-09-07 MED ORDER — M.V.I. ADULT IV INJ
Freq: Once | INTRAVENOUS | Status: AC
  Filled 2022-09-07: qty 10
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
## 2022-09-07 NOTE — MAU Note (Addendum)
Zyiah Walthour is a 26 y.o. at Unknown here in MAU reporting: EMS arrival.  Took preg test last THurs.  (10 days ago). Hasn't been able to keep food down since Wed.   This morning when she threw up, "she passed out a little bit and saw stars". Has not had preg confirmed.  Coughs sometimes when vomiting.   No bleeding.  When she coughed earlier she had pain in her RLQ, only the one time. Gets and cold with vomiting.  LMP: end of Sept.  Onset of complaint: ongoing for over a wk, worse since Wed Pain score: none Vitals:   09/07/22 1235 09/07/22 1237  BP:  118/60  Pulse:  82  Resp:  18  Temp:  98 F (36.7 C)  SpO2: 100% 100%      Lab orders placed from triage:  UPT, UA if +

## 2022-09-07 NOTE — MAU Provider Note (Signed)
History     CSN: [id number]  Arrival date and time: 09/07/22 1159   Event Date/Time   First Provider Initiated Contact with Patient 09/07/22 1447      Chief Complaint  Patient presents with   Emesis   Possible Pregnancy   Dizziness   HPI Marie Faulkner is a 26 y.o. G3P0010 at [redacted]w[redacted]d who presents to MAU via EMS with chief complaint of vomiting and dizziness 2/2 early pregnancy. These are new complaints, onset Wednesday 09/03/2022. Patient has been unable to tolerate anything PO since that time. She does not have access to antiemetics. She denies SOB, chest pain, weakness, and syncope. She denies abdominal pain, vaginal bleeding, dysuria, fever or recent illness.  OB History     Gravida  3   Para      Term      Preterm      AB  1   Living         SAB      IAB  1   Ectopic      Multiple      Live Births              Past Medical History:  Diagnosis Date   Medical history non-contributory     Past Surgical History:  Procedure Laterality Date   NO PAST SURGERIES      No family history on file.  Social History   Tobacco Use   Smoking status: Never   Smokeless tobacco: Never  Vaping Use   Vaping Use: Never used  Substance Use Topics   Alcohol use: No   Drug use: Not Currently    Allergies:  Allergies  Allergen Reactions   Nickel Itching and Rash    Moderate    Medications Prior to Admission  Medication Sig Dispense Refill Last Dose   famotidine (PEPCID) 20 MG tablet Take 1 tablet (20 mg total) by mouth daily. 30 tablet 1    meloxicam (MOBIC) 7.5 MG tablet Take 1 tablet (7.5 mg total) by mouth daily. 10 tablet 0    Multiple Vitamin (MULTIVITAMIN) capsule Take 1 capsule by mouth daily. 100 capsule 0     Review of Systems  Constitutional:  Positive for fatigue.  Gastrointestinal:  Positive for nausea and vomiting.  Neurological:  Positive for dizziness.  All other systems reviewed and are negative.  Physical Exam   Blood pressure  122/67, pulse 68, temperature 97.9 F (36.6 C), temperature source Oral, resp. rate 18, height 5' 2.5" (1.588 m), weight 86.6 kg, last menstrual period 07/13/2022, SpO2 100 %.  Physical Exam Vitals and nursing note reviewed. Exam conducted with a chaperone present.  Constitutional:      Appearance: Normal appearance. She is ill-appearing.  Cardiovascular:     Rate and Rhythm: Normal rate and regular rhythm.     Pulses: Normal pulses.     Heart sounds: Normal heart sounds.  Pulmonary:     Effort: Pulmonary effort is normal.     Breath sounds: Normal breath sounds.  Abdominal:     General: Abdomen is flat.  Skin:    Capillary Refill: Capillary refill takes less than 2 seconds.  Neurological:     Mental Status: She is alert and oriented to person, place, and time.  Psychiatric:        Mood and Affect: Mood normal.        Behavior: Behavior normal.        Thought Content: Thought content normal.  Judgment: Judgment normal.     MAU Course  Procedures  MDM Patient Vitals for the past 24 hrs:  BP Temp Temp src Pulse Resp SpO2 Height Weight  09/07/22 1502 (!) 107/40 97.8 F (36.6 C) Oral (!) 52 16 -- -- --  09/07/22 1308 122/67 97.9 F (36.6 C) Oral 68 -- -- -- --  09/07/22 1237 118/60 98 F (36.7 C) Oral 82 18 100 % 5' 2.5" (1.588 m) 86.6 kg  09/07/22 1235 -- -- -- -- -- 100 % -- --   Orders Placed This Encounter  Procedures   Urinalysis, Routine w reflex microscopic Urine, Clean Catch   Rapid urine drug screen (hospital performed)   CBC with Differential/Platelet   Basic metabolic panel   hCG, quantitative, pregnancy   Pregnancy, urine POC   Insert peripheral IV   Discharge patient   Results for orders placed or performed during the hospital encounter of 09/07/22 (from the past 24 hour(s))  Pregnancy, urine POC     Status: Abnormal   Collection Time: 09/07/22 12:49 PM  Result Value Ref Range   Preg Test, Ur POSITIVE (A) NEGATIVE  Urinalysis, Routine w reflex  microscopic Urine, Clean Catch     Status: Abnormal   Collection Time: 09/07/22 12:51 PM  Result Value Ref Range   Color, Urine AMBER (A) YELLOW   APPearance HAZY (A) CLEAR   Specific Gravity, Urine 1.036 (H) 1.005 - 1.030   pH 6.0 5.0 - 8.0   Glucose, UA NEGATIVE NEGATIVE mg/dL   Hgb urine dipstick NEGATIVE NEGATIVE   Bilirubin Urine NEGATIVE NEGATIVE   Ketones, ur 80 (A) NEGATIVE mg/dL   Protein, ur 409 (A) NEGATIVE mg/dL   Nitrite NEGATIVE NEGATIVE   Leukocytes,Ua NEGATIVE NEGATIVE   RBC / HPF 0-5 0 - 5 RBC/hpf   WBC, UA 0-5 0 - 5 WBC/hpf   Bacteria, UA NONE SEEN NONE SEEN   Squamous Epithelial / LPF 11-20 0 - 5   Mucus PRESENT   Rapid urine drug screen (hospital performed)     Status: Abnormal   Collection Time: 09/07/22 12:51 PM  Result Value Ref Range   Opiates NONE DETECTED NONE DETECTED   Cocaine NONE DETECTED NONE DETECTED   Benzodiazepines NONE DETECTED NONE DETECTED   Amphetamines NONE DETECTED NONE DETECTED   Tetrahydrocannabinol POSITIVE (A) NONE DETECTED   Barbiturates NONE DETECTED NONE DETECTED  CBC with Differential/Platelet     Status: None   Collection Time: 09/07/22 12:57 PM  Result Value Ref Range   WBC 6.8 4.0 - 10.5 K/uL   RBC 4.78 3.87 - 5.11 MIL/uL   Hemoglobin 14.0 12.0 - 15.0 g/dL   HCT 81.1 91.4 - 78.2 %   MCV 84.1 80.0 - 100.0 fL   MCH 29.3 26.0 - 34.0 pg   MCHC 34.8 30.0 - 36.0 g/dL   RDW 95.6 21.3 - 08.6 %   Platelets 255 150 - 400 K/uL   nRBC 0.0 0.0 - 0.2 %   Neutrophils Relative % 62 %   Neutro Abs 4.2 1.7 - 7.7 K/uL   Lymphocytes Relative 30 %   Lymphs Abs 2.0 0.7 - 4.0 K/uL   Monocytes Relative 8 %   Monocytes Absolute 0.5 0.1 - 1.0 K/uL   Eosinophils Relative 0 %   Eosinophils Absolute 0.0 0.0 - 0.5 K/uL   Basophils Relative 0 %   Basophils Absolute 0.0 0.0 - 0.1 K/uL   Immature Granulocytes 0 %   Abs Immature Granulocytes 0.03 0.00 -  0.07 K/uL  Basic metabolic panel     Status: Abnormal   Collection Time: 09/07/22 12:57 PM   Result Value Ref Range   Sodium 136 135 - 145 mmol/L   Potassium 3.0 (L) 3.5 - 5.1 mmol/L   Chloride 102 98 - 111 mmol/L   CO2 18 (L) 22 - 32 mmol/L   Glucose, Bld 90 70 - 99 mg/dL   BUN 17 6 - 20 mg/dL   Creatinine, Ser 7.62 0.44 - 1.00 mg/dL   Calcium 9.8 8.9 - 26.3 mg/dL   GFR, Estimated >33 >54 mL/min   Anion gap 16 (H) 5 - 15  hCG, quantitative, pregnancy     Status: Abnormal   Collection Time: 09/07/22 12:57 PM  Result Value Ref Range   hCG, Beta Chain, Quant, S 145,656 (H) <5 mIU/mL   Meds ordered this encounter  Medications   lactated ringers bolus 1,000 mL   ondansetron (ZOFRAN) injection 4 mg   M.V.I. Adult (INFUVITE ADULT) 10 mL in lactated ringers 1,000 mL infusion   haloperidol lactate (HALDOL) injection 2 mg   diphenhydrAMINE (BENADRYL) injection 25 mg   Prenatal Vit-Fe Fumarate-FA (PREPLUS) 27-1 MG TABS    Sig: Take 1 tablet by mouth daily.    Dispense:  30 tablet    Refill:  13    Order Specific Question:   Supervising Provider    Answer:   Jaynie Collins A [[id number]]   promethazine (PHENERGAN) 12.5 MG tablet    Sig: Take 1 tablet (12.5 mg total) by mouth every 6 (six) hours as needed for nausea or vomiting.    Dispense:  30 tablet    Refill:  0    Order Specific Question:   Supervising Provider    Answer:   Jaynie Collins A [[id number]]   potassium chloride (KLOR-CON) 10 MEQ tablet    Sig: Take 1 tablet (10 mEq total) by mouth daily for 5 days.    Dispense:  5 tablet    Refill:  0    Order Specific Question:   Supervising Provider    Answer:   Jaynie Collins A [[id number]]   Assessment and Plan  --26 y.o. G3P0010 at [redacted]w[redacted]d by LMP --Vomiting with hypokalemia in first trimester --Cannabis Hyperemesis --S/p IV rehydration --Begin Phenergan as needed, PO K+ --Discharge home in stable condition  Calvert Cantor, MSA, MSN, CNM 09/07/2022, 3:22 PM

## 2022-09-07 NOTE — Discharge Instructions (Signed)

## 2022-11-03 ENCOUNTER — Other Ambulatory Visit: Payer: Self-pay

## 2022-11-03 ENCOUNTER — Emergency Department
Admission: EM | Admit: 2022-11-03 | Discharge: 2022-11-03 | Disposition: A | Discharge status: Home / Self Care | Payer: Medicaid Other | Attending: Emergency Medicine | Admitting: Emergency Medicine

## 2022-11-03 DIAGNOSIS — N390 Urinary tract infection, site not specified: Secondary | ICD-10-CM | POA: Diagnosis not present

## 2022-11-03 DIAGNOSIS — Z3A16 16 weeks gestation of pregnancy: Secondary | ICD-10-CM | POA: Insufficient documentation

## 2022-11-03 DIAGNOSIS — B9689 Other specified bacterial agents as the cause of diseases classified elsewhere: Secondary | ICD-10-CM | POA: Diagnosis not present

## 2022-11-03 DIAGNOSIS — N760 Acute vaginitis: Secondary | ICD-10-CM | POA: Diagnosis not present

## 2022-11-03 DIAGNOSIS — O26892 Other specified pregnancy related conditions, second trimester: Secondary | ICD-10-CM | POA: Diagnosis not present

## 2022-11-03 DIAGNOSIS — Z1152 Encounter for screening for COVID-19: Secondary | ICD-10-CM | POA: Diagnosis not present

## 2022-11-03 DIAGNOSIS — A599 Trichomoniasis, unspecified: Secondary | ICD-10-CM | POA: Insufficient documentation

## 2022-11-03 DIAGNOSIS — R1030 Lower abdominal pain, unspecified: Secondary | ICD-10-CM

## 2022-11-03 DIAGNOSIS — O219 Vomiting of pregnancy, unspecified: Secondary | ICD-10-CM | POA: Diagnosis not present

## 2022-11-03 DIAGNOSIS — Z3490 Encounter for supervision of normal pregnancy, unspecified, unspecified trimester: Secondary | ICD-10-CM

## 2022-11-03 DIAGNOSIS — N9489 Other specified conditions associated with female genital organs and menstrual cycle: Secondary | ICD-10-CM | POA: Diagnosis not present

## 2022-11-03 LAB — WET PREP, GENITAL
Clue Cells Wet Prep HPF POC: NONE SEEN
Sperm: NONE SEEN
WBC, Wet Prep HPF POC: >=10 — AB (ref <10)
Yeast Wet Prep HPF POC: NONE SEEN

## 2022-11-03 LAB — CBC WITH DIFFERENTIAL/PLATELET
Abs Immature Granulocytes: 0.05 10*3/uL (ref 0.00–0.07)
Basophils Absolute: 0 10*3/uL (ref 0.0–0.1)
Basophils Relative: 0 %
Eosinophils Absolute: 0 10*3/uL (ref 0.0–0.5)
Eosinophils Relative: 0 %
HCT: 33.9 % — ABNORMAL LOW (ref 36.0–46.0)
Hemoglobin: 11.3 g/dL — ABNORMAL LOW (ref 12.0–15.0)
Immature Granulocytes: 1 %
Lymphocytes Relative: 9 %
Lymphs Abs: 1 10*3/uL (ref 0.7–4.0)
MCH: 29 pg (ref 26.0–34.0)
MCHC: 33.3 g/dL (ref 30.0–36.0)
MCV: 87.1 fL (ref 80.0–100.0)
Monocytes Absolute: 0.6 10*3/uL (ref 0.1–1.0)
Monocytes Relative: 6 %
Neutro Abs: 9.3 10*3/uL — ABNORMAL HIGH (ref 1.7–7.7)
Neutrophils Relative %: 84 %
Platelets: 217 10*3/uL (ref 150–400)
RBC: 3.89 MIL/uL (ref 3.87–5.11)
RDW: 13.8 % (ref 11.5–15.5)
WBC: 10.9 10*3/uL — ABNORMAL HIGH (ref 4.0–10.5)
nRBC: 0 % (ref 0.0–0.2)

## 2022-11-03 LAB — URINALYSIS, ROUTINE W REFLEX MICROSCOPIC
Bilirubin Urine: NEGATIVE
Glucose, UA: NEGATIVE mg/dL
Hgb urine dipstick: NEGATIVE
Ketones, ur: 80 mg/dL — AB
Nitrite: NEGATIVE
Protein, ur: 30 mg/dL — AB
Specific Gravity, Urine: 1.024 (ref 1.005–1.030)
pH: 5 (ref 5.0–8.0)

## 2022-11-03 LAB — HCG, QUANTITATIVE, PREGNANCY: hCG, Beta Chain, Quant, S: 56376 m[IU]/mL — ABNORMAL HIGH (ref <5)

## 2022-11-03 LAB — COMPREHENSIVE METABOLIC PANEL
ALT: 9 U/L (ref 0–44)
AST: 14 U/L — ABNORMAL LOW (ref 15–41)
Albumin: 3.5 g/dL (ref 3.5–5.0)
Alkaline Phosphatase: 57 U/L (ref 38–126)
Anion gap: 9 (ref 5–15)
BUN: 8 mg/dL (ref 6–20)
CO2: 21 mmol/L — ABNORMAL LOW (ref 22–32)
Calcium: 8.8 mg/dL — ABNORMAL LOW (ref 8.9–10.3)
Chloride: 101 mmol/L (ref 98–111)
Creatinine, Ser: 0.56 mg/dL (ref 0.44–1.00)
GFR, Estimated: >60 mL/min (ref >60)
Glucose, Bld: 89 mg/dL (ref 70–99)
Potassium: 3.2 mmol/L — ABNORMAL LOW (ref 3.5–5.1)
Sodium: 131 mmol/L — ABNORMAL LOW (ref 135–145)
Total Bilirubin: 0.5 mg/dL (ref 0.3–1.2)
Total Protein: 7.3 g/dL (ref 6.5–8.1)

## 2022-11-03 LAB — RESP PANEL BY RT-PCR (RSV, FLU A&B, COVID)  RVPGX2
Influenza A by PCR: NEGATIVE
Influenza B by PCR: NEGATIVE
Resp Syncytial Virus by PCR: NEGATIVE
SARS Coronavirus 2 by RT PCR: NEGATIVE

## 2022-11-03 LAB — CHLAMYDIA/NGC RT PCR (ARMC ONLY)
Chlamydia Tr: NOT DETECTED
N gonorrhoeae: NOT DETECTED

## 2022-11-03 LAB — POC URINE PREG, ED: Preg Test, Ur: POSITIVE — AB

## 2022-11-03 LAB — LIPASE, BLOOD: Lipase: 28 U/L (ref 11–51)

## 2022-11-03 MED ORDER — METRONIDAZOLE 500 MG PO TABS: 500.0000 mg | ORAL_TABLET | Freq: Two times a day (BID) | ORAL | 0 refills | Status: AC

## 2022-11-03 MED ORDER — CEFDINIR 300 MG PO CAPS: 300.0000 mg | ORAL_CAPSULE | Freq: Two times a day (BID) | ORAL | 0 refills | Status: AC

## 2022-11-03 MED ORDER — METRONIDAZOLE 500 MG PO TABS
500.0000 mg | ORAL_TABLET | Freq: Once | ORAL | Status: AC
  Administered 2022-11-03: 500 mg via ORAL
  Filled 2022-11-03: qty 1

## 2022-11-03 MED ORDER — ONDANSETRON 8 MG PO TBDP: 8.0000 mg | ORAL_TABLET | Freq: Three times a day (TID) | ORAL | 0 refills | Status: AC | PRN

## 2022-11-03 MED ORDER — CEFDINIR 300 MG PO CAPS
300.0000 mg | ORAL_CAPSULE | Freq: Once | ORAL | Status: AC
  Administered 2022-11-03: 300 mg via ORAL
  Filled 2022-11-03: qty 1

## 2022-11-03 MED ORDER — ONDANSETRON 4 MG PO TBDP
8.0000 mg | ORAL_TABLET | Freq: Once | ORAL | Status: AC
  Administered 2022-11-03: 8 mg via ORAL
  Filled 2022-11-03: qty 2

## 2022-11-03 NOTE — Discharge Instructions (Addendum)
You are being discharged with 3 prescriptions:  Cefdinir/Omnicef antibiotic for UTI.  Twice daily for 5 days  Metronidazole/Flagyl antibiotic for trichomoniasis.  Twice daily for 5 days.  You can take these 2 antibiotics at the same time.  Do not drink alcohol while taking Flagyl (or while pregnant).  Zofran nausea medicine to use as needed  Use Tylenol for pain and fevers.  Up to 1000 mg per dose, up to 4 times per day.  Do not take more than 4000 mg of Tylenol/acetaminophen within 24 hours.Marland Kitchen

## 2022-11-03 NOTE — ED Notes (Signed)
Fetal heart tones: 145 BPM

## 2022-11-03 NOTE — ED Provider Notes (Signed)
[facility] Provider Note   Event Date/Time   First MD Initiated Contact with Patient 11/03/22 2223     (approximate) History  Back Pain and Abdominal Pain  HPI Marie Faulkner is a 27 y.o. female with a stated past medical history of 15-16-week pregnancy who presents for sharp bilateral lower abdominal pain that radiates into the lower back and began earlier today.  Patient also endorses 3-4 episodes of mucousy emesis that began today as well.  Patient denies any vaginal bleeding or abnormal vaginal discharge.  Patient states that she has not had any prenatal care so far but does have an OB/GYN that she plans to follow-up with. ROS: Patient currently denies any vision changes, tinnitus, difficulty speaking, facial droop, sore throat, chest pain, shortness of breath, abdominal pain, diarrhea, dysuria, or weakness/numbness/paresthesias in any extremity   Physical Exam  Triage Vital Signs: ED Triage Vitals  Enc Vitals Group     BP 11/03/22 1941 120/60     Pulse Rate 11/03/22 1941 (!) 104     Resp 11/03/22 1941 20     Temp 11/03/22 1941 98.1 F (36.7 C)     Temp Source 11/03/22 1941 Oral     SpO2 11/03/22 1941 98 %     Weight 11/03/22 1942 189 lb 9.5 oz (86 kg)     Height 11/03/22 1942 5\' 2"  (1.575 m)     Head Circumference --      Peak Flow --      Pain Score 11/03/22 1942 0     Pain Loc --      Pain Edu? --      Excl. in [place]? --    Most recent vital signs: Vitals:   11/03/22 1941  BP: 120/60  Pulse: (!) 104  Resp: 20  Temp: 98.1 F (36.7 C)  SpO2: 98%   General: Awake, oriented x4. CV:  Good peripheral perfusion.  Resp:  Normal effort.  Abd:  No distention.  Other:  Young adult overweight African-American female laying in bed in no acute distress ED Results / Procedures / Treatments  Labs (all labs ordered are listed, but only abnormal results are displayed) Labs Reviewed  WET PREP, GENITAL - Abnormal; Notable for the following components:       Result Value   Trich, Wet Prep PRESENT (*)    WBC, Wet Prep HPF POC >=10 (*)    All other components within normal limits  CBC WITH DIFFERENTIAL/PLATELET - Abnormal; Notable for the following components:   WBC 10.9 (*)    Hemoglobin 11.3 (*)    HCT 33.9 (*)    Neutro Abs 9.3 (*)    All other components within normal limits  COMPREHENSIVE METABOLIC PANEL - Abnormal; Notable for the following components:   Sodium 131 (*)    Potassium 3.2 (*)    CO2 21 (*)    Calcium 8.8 (*)    AST 14 (*)    All other components within normal limits  URINALYSIS, ROUTINE W REFLEX MICROSCOPIC - Abnormal; Notable for the following components:   Color, Urine YELLOW (*)    APPearance CLOUDY (*)    Ketones, ur 80 (*)    Protein, ur 30 (*)    Leukocytes,Ua LARGE (*)    Bacteria, UA RARE (*)    All other components within normal limits  HCG, QUANTITATIVE, PREGNANCY - Abnormal; Notable for the following components:   hCG, Beta Chain, Quant, S 56,376 (*)    All other  components within normal limits  POC URINE PREG, ED - Abnormal; Notable for the following components:   Preg Test, Ur POSITIVE (*)    All other components within normal limits  RESP PANEL BY RT-PCR (RSV, FLU A&B, COVID)  [id number]  CHLAMYDIA/NGC RT PCR ([facility] ONLY)            LIPASE, BLOOD  PROCEDURES: Critical Care performed: No .1-3 Lead EKG Interpretation  Performed by: Naaman Plummer, MD Authorized by: Naaman Plummer, MD     Interpretation: normal     ECG rate:  71   ECG rate assessment: normal     Rhythm: sinus rhythm     Ectopy: none     Conduction: normal    MEDICATIONS ORDERED IN ED: Medications  ondansetron (ZOFRAN-ODT) disintegrating tablet 8 mg (has no administration in time range)  cefdinir (OMNICEF) capsule 300 mg (has no administration in time range)  metroNIDAZOLE (FLAGYL) tablet 500 mg (has no administration in time range)   IMPRESSION / MDM / ASSESSMENT AND PLAN / ED COURSE  I reviewed the triage vital signs and  the nursing notes.                             The patient is on the cardiac monitor to evaluate for evidence of arrhythmia and/or significant heart rate changes. Patient's presentation is most consistent with acute presentation with potential threat to life or bodily function. Patient is a 27 year old female who presents for bilateral lower quadrant abdominal pain that radiates to the back in the setting of 15-16-week pregnancy Unlikely TOA, Ovarian Torsion, PID, gonorrhea/chlamydia. Low suspicion for Infected Urolithiasis, AAA, Cholecystitis, Pancreatitis, SBO, Appendicitis, or other acute abdomen. Urinalysis positive for bacteria and wet prep positive for trichomoniasis Rx: Cefdinir 300 mg BID for 5 days, Flagyl 500 mg twice daily for 5 days Disposition: Discharge home. SRP discussed. Advise follow up with primary care provider within 24-72 hours.   FINAL CLINICAL IMPRESSION(S) / ED DIAGNOSES   Final diagnoses:  Urinary tract infection without hematuria, site unspecified  Bacterial vaginosis  Lower abdominal pain  Pregnancy, unspecified gestational age   Rx / DC Orders   ED Discharge Orders          Ordered    metroNIDAZOLE (FLAGYL) 500 MG tablet  2 times daily        11/03/22 2243    cefdinir (OMNICEF) 300 MG capsule  2 times daily        11/03/22 2243    ondansetron (ZOFRAN-ODT) 8 MG disintegrating tablet  Every 8 hours PRN        11/03/22 2243           Note:  This document was prepared using Dragon voice recognition software and may include unintentional dictation errors.   Naaman Plummer, MD 11/03/22 [phone]

## 2022-11-03 NOTE — ED Triage Notes (Signed)
Patient reports she woke up today with nasal congestion and cough and vomiting mucous 3-4 times today. Denies fever. Reports she is 15-[redacted] weeks pregnant and began having sharp bilateral lower abdominal pain and lower back pain. Denies vaginal bleeding. States she has not had any prenatal care. AOX4. Resp even, unlabored on RA. Ambulatory with steady gait.

## 2022-11-27 ENCOUNTER — Ambulatory Visit (LOCAL_COMMUNITY_HEALTH_CENTER): Payer: Self-pay

## 2022-11-27 VITALS — BP 114/57 | Ht 62.5 in | Wt 209.5 lb

## 2022-11-27 DIAGNOSIS — Z3201 Encounter for pregnancy test, result positive: Secondary | ICD-10-CM

## 2022-11-27 LAB — PREGNANCY, URINE: Preg Test, Ur: POSITIVE — AB

## 2022-11-27 MED ORDER — PRENATAL 27-0.8 MG PO TABS: 1.0000 | ORAL_TABLET | Freq: Every day | ORAL | 0 refills | Status: AC

## 2022-11-27 NOTE — Progress Notes (Signed)
UPT positive. Plans Prenatal care at [facility]. To clerk for preadmit.

## 2023-06-21 IMAGING — CR DG CHEST 2V
1 series · 2 of 2 positions shown · non-contrast
Comparison: None.

CLINICAL DATA: Chest pain

EXAM:
CHEST - 2 VIEW

[Series 1: dg chest 2 view · 0.14mm/px · 2 of 2 slices shown]
[im 1/2]
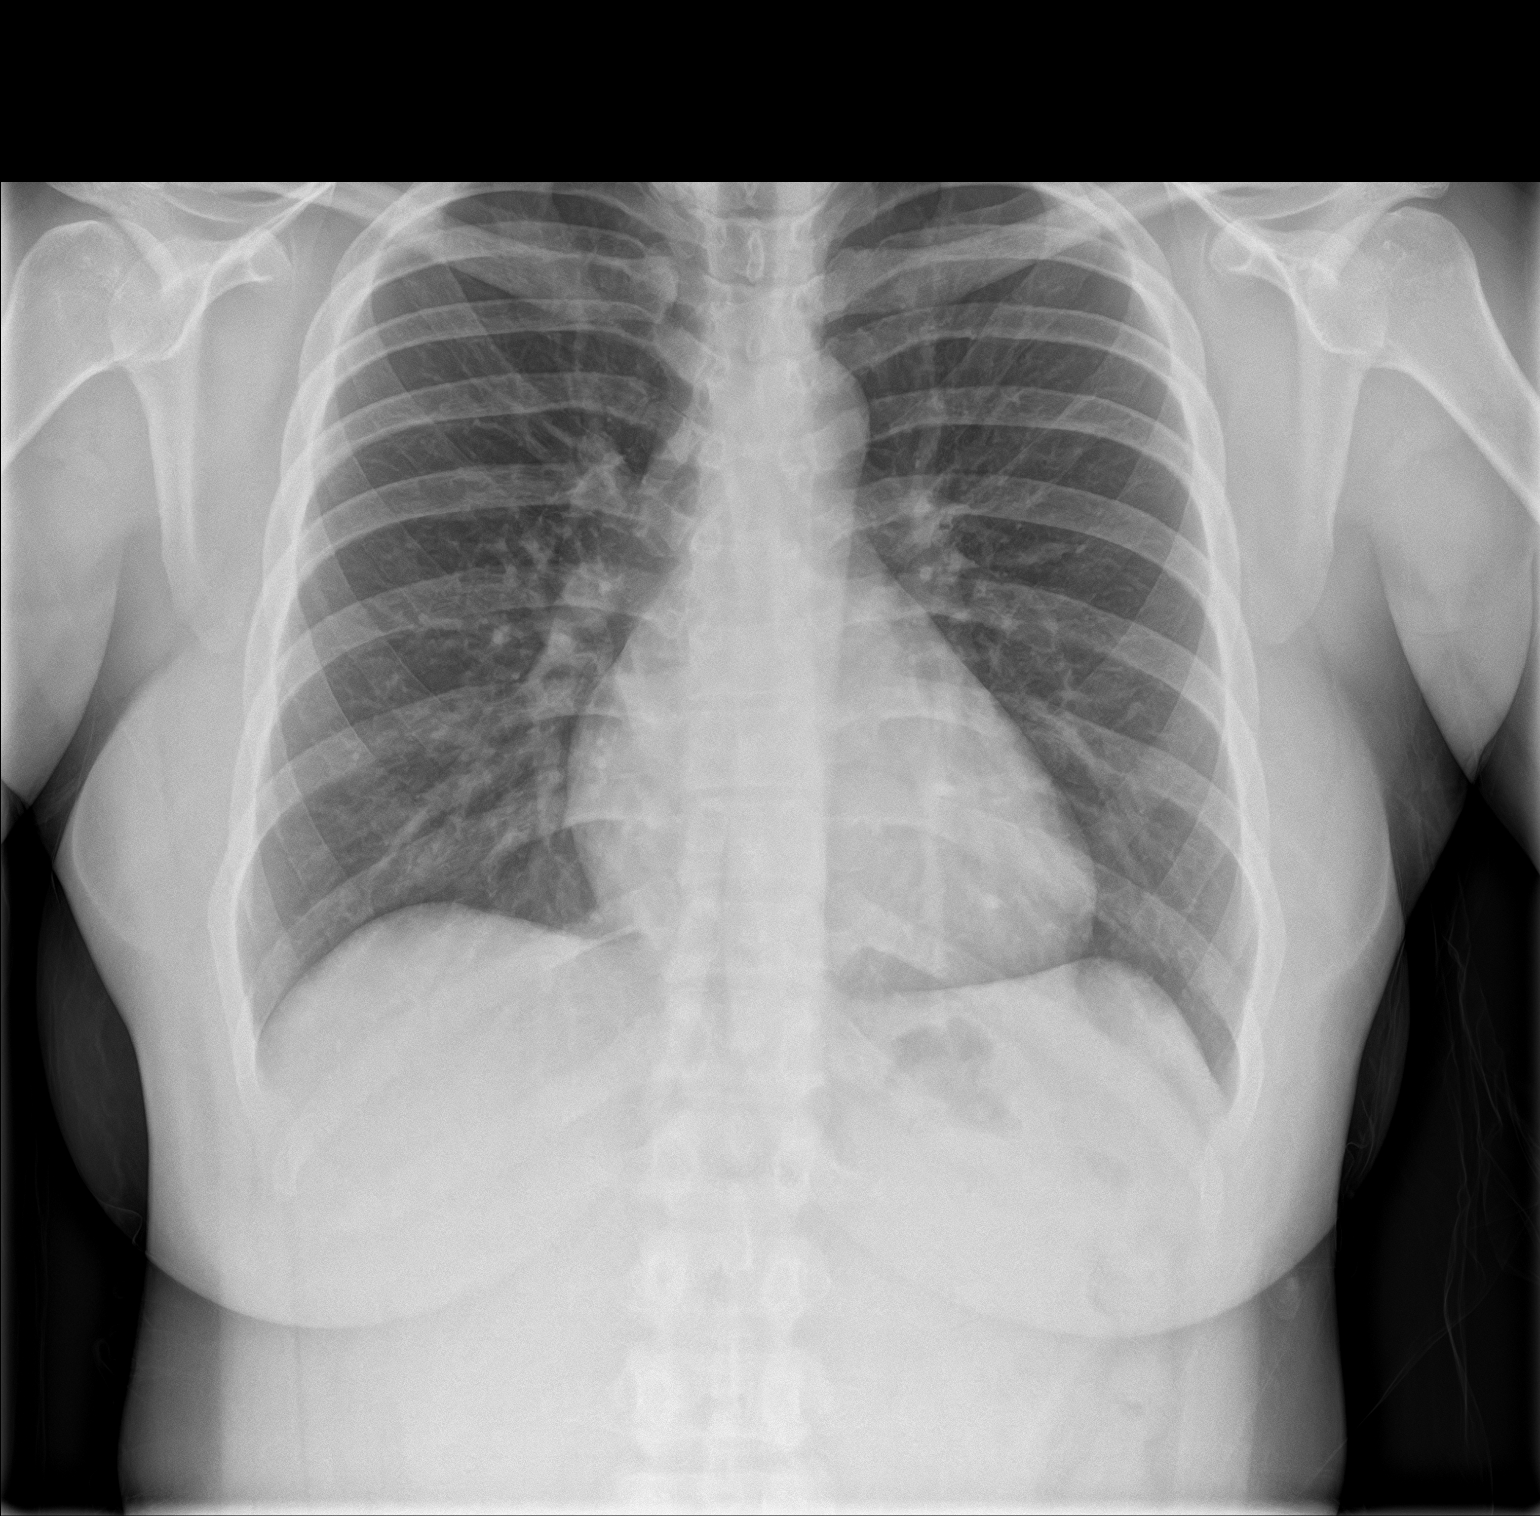
[im 2/2]
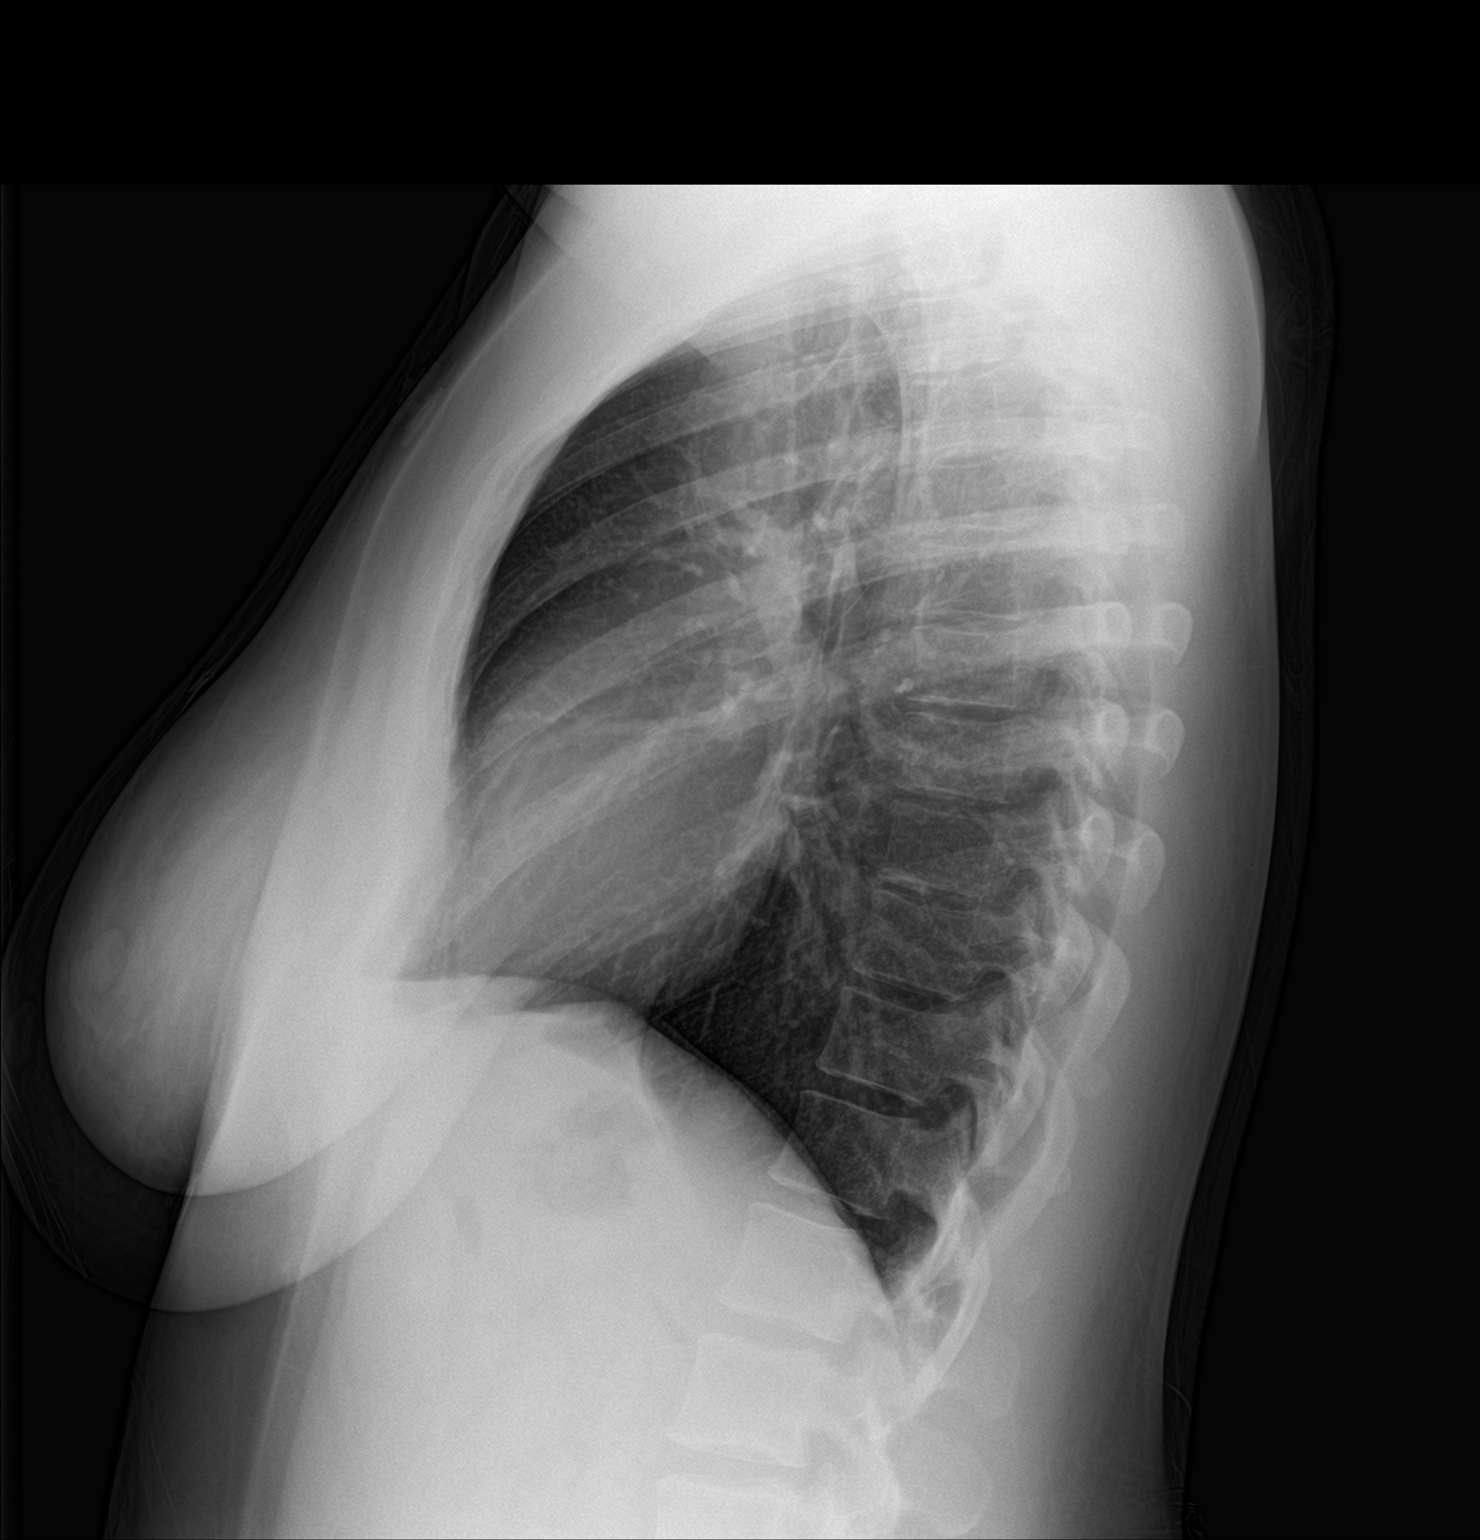

[2 of 2 positions shown; findings below may reference images not displayed]

FINDINGS: The heart size and mediastinal contours are within normal limits.
Both lungs are clear. The visualized skeletal structures are
unremarkable.
IMPRESSION: No evidence of acute cardiopulmonary disease.
